# Patient Record
Sex: Male | Born: 1935 | Race: White | Hispanic: No | Smoking: Never smoker
Health system: Southern US, Community
[De-identification: ages and names within clinical notes are randomized; demographics above are authoritative.]

## PROBLEM LIST (undated history)

## (undated) DIAGNOSIS — F0390 Unspecified dementia without behavioral disturbance: Secondary | ICD-10-CM

## (undated) DIAGNOSIS — F419 Anxiety disorder, unspecified: Secondary | ICD-10-CM

## (undated) DIAGNOSIS — A159 Respiratory tuberculosis unspecified: Secondary | ICD-10-CM

## (undated) DIAGNOSIS — I10 Essential (primary) hypertension: Secondary | ICD-10-CM

## (undated) HISTORY — PX: APPENDECTOMY: SHX54

## (undated) HISTORY — PX: JOINT REPLACEMENT: SHX530

---

## 2016-11-12 ENCOUNTER — Ambulatory Visit: Payer: Medicare Other

## 2016-11-12 ENCOUNTER — Encounter: Payer: Self-pay | Admitting: Emergency Medicine

## 2016-11-12 ENCOUNTER — Ambulatory Visit
Admission: EM | Admit: 2016-11-12 | Discharge: 2016-11-12 | Disposition: A | Discharge status: Home / Self Care | Payer: Medicare Other | Attending: Family Medicine | Admitting: Family Medicine

## 2016-11-12 DIAGNOSIS — R531 Weakness: Secondary | ICD-10-CM | POA: Diagnosis not present

## 2016-11-12 DIAGNOSIS — Z79899 Other long term (current) drug therapy: Secondary | ICD-10-CM | POA: Insufficient documentation

## 2016-11-12 DIAGNOSIS — J209 Acute bronchitis, unspecified: Secondary | ICD-10-CM | POA: Insufficient documentation

## 2016-11-12 DIAGNOSIS — R509 Fever, unspecified: Secondary | ICD-10-CM

## 2016-11-12 HISTORY — DX: Unspecified dementia, unspecified severity, without behavioral disturbance, psychotic disturbance, mood disturbance, and anxiety: F03.90

## 2016-11-12 HISTORY — DX: Respiratory tuberculosis unspecified: A15.9

## 2016-11-12 HISTORY — DX: Essential (primary) hypertension: I10

## 2016-11-12 HISTORY — DX: Anxiety disorder, unspecified: F41.9

## 2016-11-12 LAB — BASIC METABOLIC PANEL
Anion gap: 8 (ref 5–15)
BUN: 25 mg/dL — AB (ref 6–20)
CHLORIDE: 100 mmol/L — AB (ref 101–111)
CO2: 25 mmol/L (ref 22–32)
Calcium: 8.8 mg/dL — ABNORMAL LOW (ref 8.9–10.3)
Creatinine, Ser: 1.26 mg/dL — ABNORMAL HIGH (ref 0.61–1.24)
GFR calc non Af Amer: 49 mL/min — ABNORMAL LOW (ref >60)
GFR, EST AFRICAN AMERICAN: 57 mL/min — AB (ref >60)
Glucose, Bld: 130 mg/dL — ABNORMAL HIGH (ref 65–99)
POTASSIUM: 4.2 mmol/L (ref 3.5–5.1)
SODIUM: 133 mmol/L — AB (ref 135–145)

## 2016-11-12 LAB — CBC WITH DIFFERENTIAL/PLATELET
BASOS ABS: 0.1 10*3/uL (ref 0–0.1)
Basophils Relative: 1 %
EOS PCT: 2 %
Eosinophils Absolute: 0.1 10*3/uL (ref 0–0.7)
HEMATOCRIT: 36.5 % — AB (ref 40.0–52.0)
HEMOGLOBIN: 12.6 g/dL — AB (ref 13.0–18.0)
LYMPHS PCT: 25 %
Lymphs Abs: 2 10*3/uL (ref 1.0–3.6)
MCH: 31.5 pg (ref 26.0–34.0)
MCHC: 34.5 g/dL (ref 32.0–36.0)
MCV: 91.3 fL (ref 80.0–100.0)
Monocytes Absolute: 0.7 10*3/uL (ref 0.2–1.0)
Monocytes Relative: 9 %
NEUTROS ABS: 5.3 10*3/uL (ref 1.4–6.5)
Neutrophils Relative %: 63 %
Platelets: 208 10*3/uL (ref 150–440)
RBC: 3.99 MIL/uL — AB (ref 4.40–5.90)
RDW: 13.5 % (ref 11.5–14.5)
WBC: 8.3 10*3/uL (ref 3.8–10.6)

## 2016-11-12 LAB — URINALYSIS, COMPLETE (UACMP) WITH MICROSCOPIC
BILIRUBIN URINE: NEGATIVE
Glucose, UA: NEGATIVE mg/dL
Ketones, ur: NEGATIVE mg/dL
Leukocytes, UA: NEGATIVE
NITRITE: NEGATIVE
PROTEIN: NEGATIVE mg/dL
Specific Gravity, Urine: 1.015 (ref 1.005–1.030)
pH: 6.5 (ref 5.0–8.0)

## 2016-11-12 MED ORDER — DOXYCYCLINE HYCLATE 100 MG PO TABS: 100.0000 mg | ORAL_TABLET | Freq: Two times a day (BID) | ORAL | 0 refills | Status: DC

## 2016-11-12 NOTE — ED Provider Notes (Signed)
[facility]    CSN: [id number] Arrival date & time: 11/12/16  1908     History   Chief Complaint Chief Complaint  Patient presents with  . Weakness  . Chills    HPI Joel Charles is a 83 y.o. male.   83 yo male presents with a c/o 1 week h/o generalized weakness and 12 hours of intermittent chills that started this morning. Denies any chest pain, shortness of breath, dysuria, vomiting, diarrhea, abdominal pain, difficulty urinating.    The history is provided by the patient.    Past Medical History:  Diagnosis Date  . Anxiety   . Dementia   . Hypertension   . Tuberculosis     There are no active problems to display for this patient.   Past Surgical History:  Procedure Laterality Date  . APPENDECTOMY    . JOINT REPLACEMENT     bilateral knee       Home Medications    Prior to Admission medications   Medication Sig Start Date End Date Taking? Authorizing Provider  ALPRAZolam (XANAX) 0.25 MG tablet Take 0.25 mg by mouth at bedtime. And 12.5 mg three times per day prn   Yes [provider]  amLODipine (NORVASC) 5 MG tablet Take 5 mg by mouth daily.   Yes [provider]  prednisoLONE acetate (PRED FORTE) 1 % ophthalmic suspension Place 1 drop into both eyes 3 (three) times daily.   Yes [provider]  doxycycline (VIBRA-TABS) 100 MG tablet Take 1 tablet (100 mg total) by mouth 2 (two) times daily. 11/12/16   Payton Mccallum, MD    Family History History reviewed. No pertinent family history.  Social History Social History  Substance Use Topics  . Smoking status: Never Smoker  . Smokeless tobacco: Never Used  . Alcohol use No     Allergies   Patient has no known allergies.   Review of Systems Review of Systems   Physical Exam Triage Vital Signs ED Triage Vitals  Enc Vitals Group     BP 11/12/16 1932 (!) 158/59     Pulse Rate 11/12/16 1932 67     Resp 11/12/16 1932 16     Temp 11/12/16 1932 (!)  100.4 F (38 C)     Temp Source 11/12/16 1932 Oral     SpO2 11/12/16 1932 98 %     Weight 11/12/16 1926 198 lb (89.8 kg)     Height 11/12/16 1926 6' (1.829 m)     Head Circumference --      Peak Flow --      Pain Score 11/12/16 1926 0     Pain Loc --      Pain Edu? --      Excl. in GC? --    No data found.   Updated Vital Signs BP (!) 158/59 (BP Location: Left Arm)   Pulse 67   Temp (!) 100.4 F (38 C) (Oral)   Resp 16   Ht 6' (1.829 m)   Wt 198 lb (89.8 kg)   SpO2 98%   BMI 26.85 kg/m   Visual Acuity Right Eye Distance:   Left Eye Distance:   Bilateral Distance:    Right Eye Near:   Left Eye Near:    Bilateral Near:     Physical Exam  Constitutional: He appears well-developed and well-nourished. No distress.  HENT:  Head: Normocephalic and atraumatic.  Right Ear: Tympanic membrane, external ear and ear canal normal.  Left Ear:  Tympanic membrane, external ear and ear canal normal.  Nose: Nose normal.  Mouth/Throat: Uvula is midline, oropharynx is clear and moist and mucous membranes are normal. No oropharyngeal exudate or tonsillar abscesses.  Eyes: Pupils are equal, round, and reactive to light. Conjunctivae and EOM are normal. Right eye exhibits no discharge. Left eye exhibits no discharge. No scleral icterus.  Neck: Normal range of motion. Neck supple. No tracheal deviation present. No thyromegaly present.  Cardiovascular: Normal rate, regular rhythm and normal heart sounds.   Pulmonary/Chest: Effort normal. No stridor. No respiratory distress. He has no wheezes. He has rales (bases bilaterally (left greater than right)). He exhibits no tenderness.  Lymphadenopathy:    He has no cervical adenopathy.  Neurological: He is alert.  Skin: Skin is warm and dry. No rash noted. He is not diaphoretic.  Nursing note and vitals reviewed.    UC Treatments / Results  Labs (all labs ordered are listed, but only abnormal results are displayed) Labs Reviewed  CBC WITH  DIFFERENTIAL/PLATELET - Abnormal; Notable for the following:       Result Value   RBC 3.99 (*)    Hemoglobin 12.6 (*)    HCT 36.5 (*)    All other components within normal limits  BASIC METABOLIC PANEL - Abnormal; Notable for the following:    Sodium 133 (*)    Chloride 100 (*)    Glucose, Bld 130 (*)    BUN 25 (*)    Creatinine, Ser 1.26 (*)    Calcium 8.8 (*)    GFR calc non Af Amer 49 (*)    GFR calc Af Amer 57 (*)    All other components within normal limits  URINALYSIS, COMPLETE (UACMP) WITH MICROSCOPIC - Abnormal; Notable for the following:    Hgb urine dipstick TRACE (*)    Squamous Epithelial / LPF 0-5 (*)    Bacteria, UA RARE (*)    All other components within normal limits  URINE CULTURE    EKG  EKG Interpretation None       Radiology Dg Chest 2 View  Result Date: 11/12/2016 CLINICAL DATA:  Fever and weakness today. EXAM: CHEST  2 VIEW COMPARISON:  None. FINDINGS: The cardiomediastinal silhouette is unremarkable. Right upper lung scarring identified. There is no evidence of focal airspace disease, pulmonary edema, suspicious pulmonary nodule/mass, pleural effusion, or pneumothorax. No acute bony abnormalities are identified. Remote left rib fractures are present. IMPRESSION: No evidence of acute cardiopulmonary disease. Electronically Signed   By: Harmon PierJeffrey  Hu M.D.   On: 11/12/2016 20:33    Procedures Procedures (including critical care time)  Medications Ordered in UC Medications - No data to display   Initial Impression / Assessment and Plan / UC Course  I have reviewed the triage vital signs and the nursing notes.  Pertinent labs & imaging results that were available during my care of the patient were reviewed by me and considered in my medical decision making (see chart for details).       Final Clinical Impressions(s) / UC Diagnoses   Final diagnoses:  Fever, unspecified  Weakness  Acute bronchitis, unspecified organism    New  Prescriptions Discharge Medication List as of 11/12/2016  8:51 PM    START taking these medications   Details  doxycycline (VIBRA-TABS) 100 MG tablet Take 1 tablet (100 mg total) by mouth 2 (two) times daily., Starting Wed 11/12/2016, Print       1. Labs/x-ray results and diagnosis reviewed with patient 2. rx  as per orders above; reviewed possible side effects, interactions, risks and benefits  3. Recommend supportive treatment with rest, fluids, tylenol prn  4. Follow-up prn if symptoms worsen or don't improve Controlled Substance Prescriptions [place] Controlled Substance Registry consulted? Not Applicable   Payton Mccallum, MD 11/12/16 2100

## 2016-11-12 NOTE — ED Triage Notes (Signed)
Patient in today with his daughter. [facility] ago and having some difficulty adjusting. Patient's daughter does state that patient has dementia. Patient c/o weakness and chills x this morning.

## 2016-11-12 NOTE — Discharge Instructions (Signed)
Increase water intake Tylenol as needed Follow up with primary care

## 2016-11-14 LAB — URINE CULTURE
CULTURE: NO GROWTH
SPECIAL REQUESTS: NORMAL

## 2017-09-05 ENCOUNTER — Emergency Department
Admission: EM | Admit: 2017-09-05 | Discharge: 2017-09-05 | Disposition: A | Discharge status: Home / Self Care | Payer: Medicare Other | Attending: Emergency Medicine | Admitting: Emergency Medicine

## 2017-09-05 ENCOUNTER — Other Ambulatory Visit: Payer: Self-pay

## 2017-09-05 ENCOUNTER — Emergency Department: Payer: Medicare Other

## 2017-09-05 DIAGNOSIS — R05 Cough: Secondary | ICD-10-CM | POA: Diagnosis not present

## 2017-09-05 DIAGNOSIS — I10 Essential (primary) hypertension: Secondary | ICD-10-CM | POA: Diagnosis not present

## 2017-09-05 DIAGNOSIS — Z96653 Presence of artificial knee joint, bilateral: Secondary | ICD-10-CM | POA: Insufficient documentation

## 2017-09-05 DIAGNOSIS — R059 Cough, unspecified: Secondary | ICD-10-CM

## 2017-09-05 LAB — BASIC METABOLIC PANEL
Anion gap: 10 (ref 5–15)
BUN: 27 mg/dL — AB (ref 8–23)
CHLORIDE: 95 mmol/L — AB (ref 98–111)
CO2: 26 mmol/L (ref 22–32)
CREATININE: 1.14 mg/dL (ref 0.61–1.24)
Calcium: 9.5 mg/dL (ref 8.9–10.3)
GFR calc non Af Amer: 55 mL/min — ABNORMAL LOW (ref >60)
Glucose, Bld: 126 mg/dL — ABNORMAL HIGH (ref 70–99)
POTASSIUM: 3.9 mmol/L (ref 3.5–5.1)
SODIUM: 131 mmol/L — AB (ref 135–145)

## 2017-09-05 LAB — CBC WITH DIFFERENTIAL/PLATELET
BASOS PCT: 1 %
Basophils Absolute: 0.1 10*3/uL (ref 0–0.1)
EOS ABS: 0.1 10*3/uL (ref 0–0.7)
EOS PCT: 1 %
HCT: 34.5 % — ABNORMAL LOW (ref 40.0–52.0)
HEMOGLOBIN: 12.3 g/dL — AB (ref 13.0–18.0)
LYMPHS ABS: 1.9 10*3/uL (ref 1.0–3.6)
Lymphocytes Relative: 21 %
MCH: 32.4 pg (ref 26.0–34.0)
MCHC: 35.6 g/dL (ref 32.0–36.0)
MCV: 90.9 fL (ref 80.0–100.0)
Monocytes Absolute: 0.9 10*3/uL (ref 0.2–1.0)
Monocytes Relative: 10 %
NEUTROS PCT: 67 %
Neutro Abs: 6 10*3/uL (ref 1.4–6.5)
PLATELETS: 203 10*3/uL (ref 150–440)
RBC: 3.8 MIL/uL — AB (ref 4.40–5.90)
RDW: 13.4 % (ref 11.5–14.5)
WBC: 8.8 10*3/uL (ref 3.8–10.6)

## 2017-09-05 LAB — URINALYSIS, COMPLETE (UACMP) WITH MICROSCOPIC
BACTERIA UA: NONE SEEN
Bilirubin Urine: NEGATIVE
Glucose, UA: NEGATIVE mg/dL
Hgb urine dipstick: NEGATIVE
Ketones, ur: NEGATIVE mg/dL
Leukocytes, UA: NEGATIVE
NITRITE: NEGATIVE
PH: 7 (ref 5.0–8.0)
Protein, ur: NEGATIVE mg/dL
Specific Gravity, Urine: 1.013 (ref 1.005–1.030)

## 2017-09-05 LAB — BRAIN NATRIURETIC PEPTIDE: B NATRIURETIC PEPTIDE 5: 337 pg/mL — AB (ref 0.0–100.0)

## 2017-09-05 LAB — TROPONIN I

## 2017-09-05 MED ORDER — AZITHROMYCIN 250 MG PO TABS: ORAL_TABLET | ORAL | 0 refills | Status: DC

## 2017-09-05 NOTE — ED Notes (Signed)
Pt to radiology.

## 2017-09-05 NOTE — ED Provider Notes (Signed)
[facility] Emergency Department Provider Note       Time seen: ----------------------------------------- 11:53 AM on 09/05/2017 -----------------------------------------   I have reviewed the triage vital signs and the nursing notes.  HISTORY   Chief Complaint No chief complaint on file.    HPI Joel Charles is a 84 y.o. male with a history of anxiety, dementia, hypertension and TB who presents to the ED for cough and congestion.  Reportedly he has had increased cough and congestion over the past several days.  He is currently a resident of [facility].  He has not had a fever or chills, but does feel weaker than normal.  He denies any chest pain at this time.  He was noted to be wheezing in route by EMS.  Past Medical History:  Diagnosis Date  . Anxiety   . Dementia   . Hypertension   . Tuberculosis     There are no active problems to display for this patient.   Past Surgical History:  Procedure Laterality Date  . APPENDECTOMY    . JOINT REPLACEMENT     bilateral knee    Allergies Patient has no known allergies.  Social History Social History   Tobacco Use  . Smoking status: Never Smoker  . Smokeless tobacco: Never Used  Substance Use Topics  . Alcohol use: No  . Drug use: No   Review of Systems Constitutional: Negative for fever. Cardiovascular: Negative for chest pain. Respiratory: Positive for cough Gastrointestinal: Negative for abdominal pain, vomiting and diarrhea. Musculoskeletal: Negative for back pain. Skin: Negative for rash. Neurological: Negative for headaches, positive for generalized weakness  All systems negative/normal/unremarkable except as stated in the HPI  ____________________________________________   PHYSICAL EXAM:  VITAL SIGNS: ED Triage Vitals  Enc Vitals Group     BP      Pulse      Resp      Temp      Temp src      SpO2      Weight      Height      Head Circumference       Peak Flow      Pain Score      Pain Loc      Pain Edu?      Excl. in GC?    Constitutional:  Well appearing and in no distress. Eyes: Conjunctivae are normal. Normal extraocular movements. ENT   Head: Normocephalic and atraumatic.   Nose: No congestion/rhinnorhea.   Mouth/Throat: Mucous membranes are moist.   Neck: No stridor. Cardiovascular: Normal rate, regular rhythm. No murmurs, rubs, or gallops. Respiratory: Normal respiratory effort without tachypnea nor retractions.  Minimal wheezing is noted Gastrointestinal: Soft and nontender. Normal bowel sounds Musculoskeletal: Nontender with normal range of motion in extremities. No lower extremity tenderness nor edema. Neurologic:  Normal speech and language. No gross focal neurologic deficits are appreciated.  Skin:  Skin is warm, dry and intact. No rash noted. Psychiatric: Mood and affect are normal. Speech and behavior are normal.  ___________________________________________  ED COURSE:  As part of my medical decision making, I reviewed the following data within the electronic MEDICAL RECORD NUMBER History obtained from family if available, nursing notes, old chart and ekg, as well as notes from prior ED visits. Patient presented for shortness of breath and cough, we will assess with labs and imaging as indicated at this time.   Procedures ____________________________________________   LABS (pertinent positives/negatives)  Labs Reviewed  CBC WITH DIFFERENTIAL/PLATELET - Abnormal; Notable for the following components:      Result Value   RBC 3.80 (*)    Hemoglobin 12.3 (*)    HCT 34.5 (*)    All other components within normal limits  BASIC METABOLIC PANEL - Abnormal; Notable for the following components:   Sodium 131 (*)    Chloride 95 (*)    Glucose, Bld 126 (*)    BUN 27 (*)    GFR calc non Af Amer 55 (*)    All other components within normal limits  BRAIN NATRIURETIC PEPTIDE - Abnormal; Notable for the  following components:   B Natriuretic Peptide 337.0 (*)    All other components within normal limits  URINALYSIS, COMPLETE (UACMP) WITH MICROSCOPIC - Abnormal; Notable for the following components:   Color, Urine YELLOW (*)    APPearance CLEAR (*)    All other components within normal limits  TROPONIN I    RADIOLOGY Images were viewed by me  Chest x-ray IMPRESSION: No evidence of acute cardiopulmonary disease.  Chronic pulmonary changes and peribronchial thickening/interstitial prominence. ____________________________________________  DIFFERENTIAL DIAGNOSIS   URI, pneumonia, CHF, unstable angina  FINAL ASSESSMENT AND PLAN  Cough  Plan: The patient had presented for cough and weakness. Patient's labs did not reveal any acute process. Patient's imaging was negative for pneumonia.  Due to his persistent cough I will prescribe a Z-Pak.  He has had cough for about a week at this point so I think this is reasonable.   Ulice DashJohnathan E Williams, MD   Note: This note was generated in part or whole with voice recognition software. Voice recognition is usually quite accurate but there are transcription errors that can and very often do occur. I apologize for any typographical errors that were not detected and corrected.     Emily FilbertWilliams, Jonathan E, MD 09/05/17 1318

## 2017-09-05 NOTE — ED Triage Notes (Signed)
Pt arrives from [facility] c/o productive cough x 1 week worsening today.

## 2017-09-05 NOTE — ED Notes (Signed)
Random urine collected and sent to lab to be held pending orders

## 2017-09-10 ENCOUNTER — Other Ambulatory Visit: Payer: Self-pay | Admitting: Emergency Medicine

## 2017-09-10 ENCOUNTER — Ambulatory Visit
Admission: RE | Admit: 2017-09-10 | Discharge: 2017-09-10 | Disposition: A | Discharge status: Home / Self Care | Payer: Medicare Other | Source: Ambulatory Visit | Attending: Emergency Medicine | Admitting: Emergency Medicine

## 2017-09-10 DIAGNOSIS — R059 Cough, unspecified: Secondary | ICD-10-CM

## 2017-09-10 DIAGNOSIS — R05 Cough: Secondary | ICD-10-CM

## 2017-11-04 ENCOUNTER — Encounter: Payer: Self-pay | Admitting: Emergency Medicine

## 2017-11-04 ENCOUNTER — Emergency Department
Admission: EM | Admit: 2017-11-04 | Discharge: 2017-11-04 | Disposition: A | Discharge status: Home / Self Care | Payer: Medicare Other | Attending: Emergency Medicine | Admitting: Emergency Medicine

## 2017-11-04 ENCOUNTER — Emergency Department: Payer: Medicare Other

## 2017-11-04 DIAGNOSIS — Y9389 Activity, other specified: Secondary | ICD-10-CM | POA: Insufficient documentation

## 2017-11-04 DIAGNOSIS — I10 Essential (primary) hypertension: Secondary | ICD-10-CM | POA: Insufficient documentation

## 2017-11-04 DIAGNOSIS — S0990XA Unspecified injury of head, initial encounter: Secondary | ICD-10-CM | POA: Diagnosis present

## 2017-11-04 DIAGNOSIS — F419 Anxiety disorder, unspecified: Secondary | ICD-10-CM | POA: Insufficient documentation

## 2017-11-04 DIAGNOSIS — Z7982 Long term (current) use of aspirin: Secondary | ICD-10-CM | POA: Diagnosis not present

## 2017-11-04 DIAGNOSIS — F0390 Unspecified dementia without behavioral disturbance: Secondary | ICD-10-CM | POA: Diagnosis not present

## 2017-11-04 DIAGNOSIS — Y998 Other external cause status: Secondary | ICD-10-CM | POA: Insufficient documentation

## 2017-11-04 DIAGNOSIS — Z96653 Presence of artificial knee joint, bilateral: Secondary | ICD-10-CM | POA: Insufficient documentation

## 2017-11-04 DIAGNOSIS — W19XXXA Unspecified fall, initial encounter: Secondary | ICD-10-CM

## 2017-11-04 DIAGNOSIS — Y92129 Unspecified place in nursing home as the place of occurrence of the external cause: Secondary | ICD-10-CM | POA: Insufficient documentation

## 2017-11-04 DIAGNOSIS — Z79899 Other long term (current) drug therapy: Secondary | ICD-10-CM | POA: Diagnosis not present

## 2017-11-04 DIAGNOSIS — W0110XA Fall on same level from slipping, tripping and stumbling with subsequent striking against unspecified object, initial encounter: Secondary | ICD-10-CM | POA: Insufficient documentation

## 2017-11-04 DIAGNOSIS — S0101XA Laceration without foreign body of scalp, initial encounter: Secondary | ICD-10-CM | POA: Insufficient documentation

## 2017-11-04 DIAGNOSIS — E860 Dehydration: Secondary | ICD-10-CM | POA: Diagnosis not present

## 2017-11-04 LAB — URINALYSIS, COMPLETE (UACMP) WITH MICROSCOPIC
Bacteria, UA: NONE SEEN
Bilirubin Urine: NEGATIVE
GLUCOSE, UA: NEGATIVE mg/dL
Hgb urine dipstick: NEGATIVE
Ketones, ur: NEGATIVE mg/dL
Leukocytes, UA: NEGATIVE
Nitrite: NEGATIVE
PROTEIN: NEGATIVE mg/dL
Specific Gravity, Urine: 1.015 (ref 1.005–1.030)
Squamous Epithelial / LPF: NONE SEEN (ref 0–5)
WBC UA: NONE SEEN WBC/hpf (ref 0–5)
pH: 5 (ref 5.0–8.0)

## 2017-11-04 LAB — CBC WITH DIFFERENTIAL/PLATELET
Abs Immature Granulocytes: 0.06 10*3/uL (ref 0.00–0.07)
BASOS PCT: 1 %
Basophils Absolute: 0.1 10*3/uL (ref 0.0–0.1)
EOS ABS: 0.3 10*3/uL (ref 0.0–0.5)
Eosinophils Relative: 4 %
HCT: 32.4 % — ABNORMAL LOW (ref 39.0–52.0)
HEMOGLOBIN: 11 g/dL — AB (ref 13.0–17.0)
Immature Granulocytes: 1 %
Lymphocytes Relative: 18 %
Lymphs Abs: 1.3 10*3/uL (ref 0.7–4.0)
MCH: 31.4 pg (ref 26.0–34.0)
MCHC: 34 g/dL (ref 30.0–36.0)
MCV: 92.6 fL (ref 80.0–100.0)
MONO ABS: 0.8 10*3/uL (ref 0.1–1.0)
MONOS PCT: 10 %
NEUTROS ABS: 5 10*3/uL (ref 1.7–7.7)
Neutrophils Relative %: 66 %
Platelets: 184 10*3/uL (ref 150–400)
RBC: 3.5 MIL/uL — AB (ref 4.22–5.81)
RDW: 12.8 % (ref 11.5–15.5)
WBC: 7.5 10*3/uL (ref 4.0–10.5)
nRBC: 0 % (ref 0.0–0.2)

## 2017-11-04 LAB — COMPREHENSIVE METABOLIC PANEL
ALK PHOS: 57 U/L (ref 38–126)
ALT: 14 U/L (ref 0–44)
AST: 19 U/L (ref 15–41)
Albumin: 4 g/dL (ref 3.5–5.0)
Anion gap: 10 (ref 5–15)
BUN: 32 mg/dL — ABNORMAL HIGH (ref 8–23)
CO2: 26 mmol/L (ref 22–32)
CREATININE: 1.41 mg/dL — AB (ref 0.61–1.24)
Calcium: 8.8 mg/dL — ABNORMAL LOW (ref 8.9–10.3)
Chloride: 99 mmol/L (ref 98–111)
GFR, EST AFRICAN AMERICAN: 49 mL/min — AB (ref >60)
GFR, EST NON AFRICAN AMERICAN: 43 mL/min — AB (ref >60)
Glucose, Bld: 105 mg/dL — ABNORMAL HIGH (ref 70–99)
Potassium: 3.5 mmol/L (ref 3.5–5.1)
Sodium: 135 mmol/L (ref 135–145)
Total Bilirubin: 0.8 mg/dL (ref 0.3–1.2)
Total Protein: 7 g/dL (ref 6.5–8.1)

## 2017-11-04 LAB — TROPONIN I

## 2017-11-04 MED ORDER — SODIUM CHLORIDE 0.9 % IV BOLUS
1000.0000 mL | Freq: Once | INTRAVENOUS | Status: AC
  Administered 2017-11-04: 1000 mL via INTRAVENOUS

## 2017-11-04 NOTE — ED Notes (Signed)
PT in NAD at time of deaprture, VSS, pt back to [facility] via aems. Grandson signed esigature due to pt preference

## 2017-11-04 NOTE — Discharge Instructions (Signed)
1.  Staples removal in 7 to 10 days. 2.  Drink plenty of fluids daily. 3.  Return to the ER for worsening symptoms, persistent vomiting, difficulty breathing or other concerns.

## 2017-11-04 NOTE — ED Notes (Signed)
Daughter states they have been waiting too long for EMS and will take pt back to facility at this time

## 2017-11-04 NOTE — ED Notes (Signed)
Pt up to use bedside urinal, pt steady on feet with this RN

## 2017-11-04 NOTE — ED Triage Notes (Signed)
Pt arrived from [facility] facility post fall, by EMS. Pt was in route to bathroom when he lost balance and fell to the floor, hitting posterior head. Pt has approximate 1 inch laceration to the posterior head. Bandage applied, bleeding controled.

## 2017-11-04 NOTE — ED Provider Notes (Signed)
[facility] Emergency Department Provider Note   ____________________________________________   First MD Initiated Contact with Patient 11/04/17 [phone]     (approximate)  I have reviewed the triage vital signs and the nursing notes.   HISTORY  Chief Complaint Fall  Level V caveat: Limited by dementia; majority of history provided by patient's daughter  HPI Joel Charles is a 84 y.o. male brought to the ED via EMS from [facility] ridge status post fall.  Reportedly patient was in route to the restroom when he lost his balance and fell to the floor, striking his head.  Patient denies LOC.  Denies anticoagulant use.  Daughter tells me patient has seemed more confused this week.  Urinalysis was performed at the facility but they have not been told results yet.  Patient denies fever, chills, chest pain, shortness of breath, abdominal pain, nausea or vomiting.   Past Medical History:  Diagnosis Date  . Anxiety   . Dementia (HCC)   . Hypertension   . Tuberculosis     There are no active problems to display for this patient.   Past Surgical History:  Procedure Laterality Date  . APPENDECTOMY    . JOINT REPLACEMENT     bilateral knee    Prior to Admission medications   Medication Sig Start Date End Date Taking? Authorizing Provider  ALPRAZolam (XANAX) 0.25 MG tablet Take 0.25 mg by mouth at bedtime.     [provider]  ALPRAZolam Prudy Feeler) 0.25 MG tablet Take 0.25 mg by mouth 3 (three) times daily as needed for anxiety.    [provider]  amLODipine (NORVASC) 5 MG tablet Take 5 mg by mouth daily.    [provider]  aspirin EC 81 MG tablet Take 81 mg by mouth every other day.    [provider]  azithromycin (ZITHROMAX Z-PAK) 250 MG tablet Take 2 tablets (500 mg) on  Day 1,  followed by 1 tablet (250 mg) once daily on Days 2 through 5. 09/05/17   Emily Filbert, MD  busPIRone (BUSPAR) 5 MG tablet Take 5 mg by mouth 2  (two) times daily.    [provider]  cholecalciferol (VITAMIN D) 1000 units tablet Take 2,000 Units by mouth daily.    [provider]  guaifenesin (ROBITUSSIN) 100 MG/5ML syrup Take 300 mg by mouth every 6 (six) hours as needed for cough or congestion.    [provider]  hydrochlorothiazide (HYDRODIURIL) 25 MG tablet Take 25 mg by mouth daily.    [provider]  memantine (NAMENDA) 10 MG tablet Take 10 mg by mouth 2 (two) times daily.    [provider]  mineral oil liquid Instill 2 drops in both ears every Monday morning    [provider]  polyethylene glycol (MIRALAX / GLYCOLAX) packet Take 17 g by mouth daily as needed for mild constipation or moderate constipation.    [provider]  prednisoLONE acetate (PRED FORTE) 1 % ophthalmic suspension Place 1 drop into both eyes 3 (three) times daily.    [provider]  sodium chloride (MURO 128) 5 % ophthalmic solution Place 1 drop into both eyes 3 (three) times daily.    [provider]  triamcinolone cream (KENALOG) 0.1 % Apply 1 application topically 2 (two) times daily as needed.    [provider]    Allergies Patient has no known allergies.  History reviewed. No pertinent family history.  Social History Social History  Tobacco Use  . Smoking status: Never Smoker  . Smokeless tobacco: Never Used  Substance Use Topics  . Alcohol use: No  . Drug use: No    Review of Systems  Constitutional: Positive for fall.  No fever/chills Eyes: No visual changes. ENT: No sore throat. Cardiovascular: Denies chest pain. Respiratory: Denies shortness of breath. Gastrointestinal: No abdominal pain.  No nausea, no vomiting.  No diarrhea.  No constipation. Genitourinary: Negative for dysuria. Musculoskeletal: Negative for back pain. Skin: Negative for rash. Neurological: Positive for confusion prior to fall.  Negative for headaches, focal weakness or  numbness.   ____________________________________________   PHYSICAL EXAM:  VITAL SIGNS: ED Triage Vitals  Enc Vitals Group     BP      Pulse      Resp      Temp      Temp src      SpO2      Weight      Height      Head Circumference      Peak Flow      Pain Score      Pain Loc      Pain Edu?      Excl. in GC?     Constitutional: Alert and oriented.  Elderly appearing and in no acute distress. Eyes: Conjunctivae are normal. PERRL. EOMI. Head: Approximately 1.5 cm horizontally linear scalp laceration to posterior scalp without active bleeding. Nose: Atraumatic. Mouth/Throat: Mucous membranes are moist.  No dental malocclusion. Neck: No stridor.  No cervical spine tenderness to palpation. Cardiovascular: Normal rate, regular rhythm. Grossly normal heart sounds.  Good peripheral circulation. Respiratory: Normal respiratory effort.  No retractions. Lungs CTAB. Gastrointestinal: Soft and nontender. No distention. No abdominal bruits. No CVA tenderness. Musculoskeletal: No spinal tenderness to palpation.  Pelvis stable.  No lower extremity tenderness nor edema.  No joint effusions. Neurologic: Alert and oriented to person and place.  Normal speech and language. No gross focal neurologic deficits are appreciated.  Skin:  Skin is warm, dry and intact. No rash noted. Psychiatric: Mood and affect are normal. Speech and behavior are normal.  ____________________________________________   LABS (all labs ordered are listed, but only abnormal results are displayed)  Labs Reviewed  CBC WITH DIFFERENTIAL/PLATELET - Abnormal; Notable for the following components:      Result Value   RBC 3.50 (*)    Hemoglobin 11.0 (*)    HCT 32.4 (*)    All other components within normal limits  COMPREHENSIVE METABOLIC PANEL - Abnormal; Notable for the following components:   Glucose, Bld 105 (*)    BUN 32 (*)    Creatinine, Ser 1.41 (*)    Calcium 8.8 (*)    GFR calc non Af Amer 43 (*)     GFR calc Af Amer 49 (*)    All other components within normal limits  URINALYSIS, COMPLETE (UACMP) WITH MICROSCOPIC - Abnormal; Notable for the following components:   Color, Urine YELLOW (*)    APPearance CLEAR (*)    All other components within normal limits  URINE CULTURE  TROPONIN I   ____________________________________________  EKG  ED ECG REPORT I, SUNG,JADE J, the attending physician, personally viewed and interpreted this ECG.   Date: 11/04/2017  EKG Time: 0408  Rate: 62  Rhythm: normal EKG, normal sinus rhythm  Axis: Normal  Intervals:none, PVC  ST&T Change: Nonspecific  ____________________________________________  RADIOLOGY  ED MD interpretation: No acute ICH or osseous injury  Official radiology report(s):  Ct Head Wo Contrast  Result Date: 11/04/2017 CLINICAL DATA:  84 y/o  M; head trauma. EXAM: CT HEAD WITHOUT CONTRAST CT CERVICAL SPINE WITHOUT CONTRAST TECHNIQUE: Multidetector CT imaging of the head and cervical spine was performed following the standard protocol without intravenous contrast. Multiplanar CT image reconstructions of the cervical spine were also generated. COMPARISON:  None. FINDINGS: CT HEAD FINDINGS Brain: No evidence of acute infarction, hemorrhage, hydrocephalus, extra-axial collection or mass lesion/mass effect. Nonspecific cortical calcifications in the bilateral frontal lobes, left occipital lobe, and the right cerebellar hemisphere likely representing sequelae of prior infectious or inflammatory process. Mild chronic microvascular ischemic changes and volume loss of the brain for age. Vascular: Calcific atherosclerosis of the carotid siphons. No hyperdense vessel identified. Skull: Normal. Negative for fracture or focal lesion. Sinuses/Orbits: Normal aeration of visible paranasal sinuses and right mastoid air cells. Left mastoid opacification. Bilateral intra-ocular lens replacement. Other: None. CT CERVICAL SPINE FINDINGS Alignment: C6-7 and  C7-T1 grade 1 anterolisthesis. Straightening of cervical lordosis. Skull base and vertebrae: No acute fracture. No primary bone lesion or focal pathologic process. Small erosive changes of the odontoid process and anterior arch of C1 with a small calcified pannus which may represent degenerative arthritis or crystal deposition disease. C4-5 left-sided vertebral body and facet fusion. Soft tissues and spinal canal: No prevertebral fluid or swelling. No visible canal hematoma. Disc levels: Cervical spondylosis with moderate discogenic degenerative changes from C3 through C6 and right greater than left facet hypertrophy. Uncovertebral and facet hypertrophy results in bony neural foraminal stenosis at the right C3-4, left C4-5, bilateral C5-6 levels. No high-grade bony canal stenosis. Upper chest: Calcified scarring in the lung apices probably representing sequelae of prior granulomatous disease. Other: 9 mm nodule within the right lobe of the thyroid. IMPRESSION: 1. No acute intracranial abnormality. 2. No acute fracture or dislocation of the cervical spine. 3. Mild chronic microvascular ischemic changes and volume loss of the brain for age. 4. Scattered calcifications in the brain compatible with prior infectious or inflammatory process. 5. Biapical scarring of the lungs with calcifications, probably sequelae of prior granulomatous disease. Moderate cervical spondylosis with grade 1 anterolisthesis at C6-7 and C7-T1. 6. Small erosive changes at the odontoid process and anterior arch of C1 and small calcified pannus may reflect crystal deposition disease or erosive arthritis. Electronically Signed   By: Mitzi Hansen M.D.   On: 11/04/2017 05:15   Ct Cervical Spine Wo Contrast  Result Date: 11/04/2017 CLINICAL DATA:  84 y/o  M; head trauma. EXAM: CT HEAD WITHOUT CONTRAST CT CERVICAL SPINE WITHOUT CONTRAST TECHNIQUE: Multidetector CT imaging of the head and cervical spine was performed following the  standard protocol without intravenous contrast. Multiplanar CT image reconstructions of the cervical spine were also generated. COMPARISON:  None. FINDINGS: CT HEAD FINDINGS Brain: No evidence of acute infarction, hemorrhage, hydrocephalus, extra-axial collection or mass lesion/mass effect. Nonspecific cortical calcifications in the bilateral frontal lobes, left occipital lobe, and the right cerebellar hemisphere likely representing sequelae of prior infectious or inflammatory process. Mild chronic microvascular ischemic changes and volume loss of the brain for age. Vascular: Calcific atherosclerosis of the carotid siphons. No hyperdense vessel identified. Skull: Normal. Negative for fracture or focal lesion. Sinuses/Orbits: Normal aeration of visible paranasal sinuses and right mastoid air cells. Left mastoid opacification. Bilateral intra-ocular lens replacement. Other: None. CT CERVICAL SPINE FINDINGS Alignment: C6-7 and C7-T1 grade 1 anterolisthesis. Straightening of cervical lordosis. Skull base and vertebrae: No acute fracture. No primary bone lesion or focal pathologic  process. Small erosive changes of the odontoid process and anterior arch of C1 with a small calcified pannus which may represent degenerative arthritis or crystal deposition disease. C4-5 left-sided vertebral body and facet fusion. Soft tissues and spinal canal: No prevertebral fluid or swelling. No visible canal hematoma. Disc levels: Cervical spondylosis with moderate discogenic degenerative changes from C3 through C6 and right greater than left facet hypertrophy. Uncovertebral and facet hypertrophy results in bony neural foraminal stenosis at the right C3-4, left C4-5, bilateral C5-6 levels. No high-grade bony canal stenosis. Upper chest: Calcified scarring in the lung apices probably representing sequelae of prior granulomatous disease. Other: 9 mm nodule within the right lobe of the thyroid. IMPRESSION: 1. No acute intracranial  abnormality. 2. No acute fracture or dislocation of the cervical spine. 3. Mild chronic microvascular ischemic changes and volume loss of the brain for age. 4. Scattered calcifications in the brain compatible with prior infectious or inflammatory process. 5. Biapical scarring of the lungs with calcifications, probably sequelae of prior granulomatous disease. Moderate cervical spondylosis with grade 1 anterolisthesis at C6-7 and C7-T1. 6. Small erosive changes at the odontoid process and anterior arch of C1 and small calcified pannus may reflect crystal deposition disease or erosive arthritis. Electronically Signed   By: Mitzi Hansen M.D.   On: 11/04/2017 05:15    ____________________________________________   PROCEDURES  Procedure(s) performed:     Marland KitchenMarland KitchenLaceration Repair Date/Time: 11/04/2017 5:34 AM Performed by: Irean Hong, MD Authorized by: Irean Hong, MD   Consent:    Consent obtained:  Verbal   Consent given by:  Patient and guardian   Risks discussed:  Infection, pain, poor cosmetic result and poor wound healing Anesthesia (see MAR for exact dosages):    Anesthesia method: PainEase. Laceration details:    Location:  Scalp   Length (cm):  1.5   Depth (mm):  1 Repair type:    Repair type:  Simple Exploration:    Hemostasis achieved with:  Direct pressure   Wound exploration: entire depth of wound probed and visualized     Contaminated: no   Treatment:    Area cleansed with:  Saline   Amount of cleaning:  Standard   Irrigation solution:  Sterile saline   Visualized foreign bodies/material removed: no   Skin repair:    Repair method:  Staples   Number of staples:  4 Approximation:    Approximation:  Loose Post-procedure details:    Patient tolerance of procedure:  Tolerated well, no immediate complications    Critical Care performed: No  ____________________________________________   INITIAL IMPRESSION / ASSESSMENT AND PLAN / ED COURSE  As part of  my medical decision making, I reviewed the following data within the electronic MEDICAL RECORD NUMBER History obtained from family, Nursing notes reviewed and incorporated, Labs reviewed, EKG interpreted, Radiograph reviewed, Discussed with admitting physician and Notes from prior ED visits   84 year old male who presents with mechanical fall.  Daughter states he has seemed more confused this week.  Will check lab work, urinalysis, CT head and cervical spine.  Will reassess.  Clinical Course as of Nov 04 652  Wed Nov 04, 2017  0532 Updated patient and daughter of all test results.  Patient tolerated staples well.  Will discharge home after completion of IV fluids.   [JS]    Clinical Course User Index [JS] Irean Hong, MD     ____________________________________________   FINAL CLINICAL IMPRESSION(S) / ED DIAGNOSES  Final diagnoses:  Fall, initial  encounter  Laceration of scalp, initial encounter  Dehydration     ED Discharge Orders    None       Note:  This document was prepared using Dragon voice recognition software and may include unintentional dictation errors.    Irean Hong, MD 11/04/17 [phone]

## 2017-11-05 LAB — URINE CULTURE: Culture: NO GROWTH

## 2018-01-08 ENCOUNTER — Inpatient Hospital Stay
Admission: EM | Admit: 2018-01-08 | Discharge: 2018-01-11 | DRG: 872 | Disposition: A | Discharge status: Hospice | Payer: Medicare Other | Attending: Internal Medicine | Admitting: Internal Medicine

## 2018-01-08 ENCOUNTER — Emergency Department: Payer: Medicare Other

## 2018-01-08 ENCOUNTER — Other Ambulatory Visit: Payer: Self-pay

## 2018-01-08 ENCOUNTER — Inpatient Hospital Stay: Payer: Medicare Other

## 2018-01-08 DIAGNOSIS — N400 Enlarged prostate without lower urinary tract symptoms: Secondary | ICD-10-CM | POA: Diagnosis present

## 2018-01-08 DIAGNOSIS — Z96653 Presence of artificial knee joint, bilateral: Secondary | ICD-10-CM | POA: Diagnosis present

## 2018-01-08 DIAGNOSIS — I10 Essential (primary) hypertension: Secondary | ICD-10-CM | POA: Diagnosis present

## 2018-01-08 DIAGNOSIS — Z515 Encounter for palliative care: Secondary | ICD-10-CM | POA: Diagnosis not present

## 2018-01-08 DIAGNOSIS — F419 Anxiety disorder, unspecified: Secondary | ICD-10-CM | POA: Diagnosis present

## 2018-01-08 DIAGNOSIS — Z978 Presence of other specified devices: Secondary | ICD-10-CM

## 2018-01-08 DIAGNOSIS — E872 Acidosis: Secondary | ICD-10-CM | POA: Diagnosis present

## 2018-01-08 DIAGNOSIS — N179 Acute kidney failure, unspecified: Secondary | ICD-10-CM | POA: Diagnosis present

## 2018-01-08 DIAGNOSIS — Z7982 Long term (current) use of aspirin: Secondary | ICD-10-CM

## 2018-01-08 DIAGNOSIS — A419 Sepsis, unspecified organism: Secondary | ICD-10-CM | POA: Diagnosis not present

## 2018-01-08 DIAGNOSIS — K559 Vascular disorder of intestine, unspecified: Secondary | ICD-10-CM | POA: Diagnosis present

## 2018-01-08 DIAGNOSIS — F0390 Unspecified dementia without behavioral disturbance: Secondary | ICD-10-CM | POA: Diagnosis present

## 2018-01-08 DIAGNOSIS — R6520 Severe sepsis without septic shock: Secondary | ICD-10-CM | POA: Diagnosis present

## 2018-01-08 DIAGNOSIS — Z79899 Other long term (current) drug therapy: Secondary | ICD-10-CM | POA: Diagnosis not present

## 2018-01-08 DIAGNOSIS — Z66 Do not resuscitate: Secondary | ICD-10-CM | POA: Diagnosis not present

## 2018-01-08 LAB — COMPREHENSIVE METABOLIC PANEL
ALBUMIN: 3.6 g/dL (ref 3.5–5.0)
ALT: 57 U/L — AB (ref 0–44)
AST: 116 U/L — AB (ref 15–41)
Alkaline Phosphatase: 63 U/L (ref 38–126)
Anion gap: 20 — ABNORMAL HIGH (ref 5–15)
BUN: 74 mg/dL — AB (ref 8–23)
CO2: 12 mmol/L — ABNORMAL LOW (ref 22–32)
CREATININE: 4.03 mg/dL — AB (ref 0.61–1.24)
Calcium: 7.9 mg/dL — ABNORMAL LOW (ref 8.9–10.3)
Chloride: 100 mmol/L (ref 98–111)
GFR calc Af Amer: 14 mL/min — ABNORMAL LOW (ref >60)
GFR, EST NON AFRICAN AMERICAN: 12 mL/min — AB (ref >60)
GLUCOSE: 97 mg/dL (ref 70–99)
POTASSIUM: 4.2 mmol/L (ref 3.5–5.1)
Sodium: 132 mmol/L — ABNORMAL LOW (ref 135–145)
TOTAL PROTEIN: 7.1 g/dL (ref 6.5–8.1)
Total Bilirubin: 1 mg/dL (ref 0.3–1.2)

## 2018-01-08 LAB — TROPONIN I: TROPONIN I: 0.1 ng/mL — AB (ref <0.03)

## 2018-01-08 LAB — CBC
HEMATOCRIT: 37.7 % — AB (ref 39.0–52.0)
Hemoglobin: 12.1 g/dL — ABNORMAL LOW (ref 13.0–17.0)
MCH: 30.6 pg (ref 26.0–34.0)
MCHC: 32.1 g/dL (ref 30.0–36.0)
MCV: 95.4 fL (ref 80.0–100.0)
NRBC: 0.2 % (ref 0.0–0.2)
Platelets: 210 10*3/uL (ref 150–400)
RBC: 3.95 MIL/uL — AB (ref 4.22–5.81)
RDW: 13.2 % (ref 11.5–15.5)
WBC: 17.4 10*3/uL — AB (ref 4.0–10.5)

## 2018-01-08 LAB — CG4 I-STAT (LACTIC ACID)
Lactic Acid, Venous: 6.1 mmol/L (ref 0.5–1.9)
Lactic Acid, Venous: 1.57 mmol/L (ref 0.5–1.9)

## 2018-01-08 LAB — MRSA PCR SCREENING: MRSA by PCR: NEGATIVE

## 2018-01-08 LAB — LIPASE, BLOOD: Lipase: 18 U/L (ref 11–51)

## 2018-01-08 MED ORDER — ALPRAZOLAM 0.25 MG PO TABS
0.2500 mg | ORAL_TABLET | Freq: Two times a day (BID) | ORAL | Status: DC
  Administered 2018-01-08: 0.25 mg via ORAL
  Filled 2018-01-08: qty 1

## 2018-01-08 MED ORDER — SODIUM CHLORIDE 0.9 % IV SOLN
2.0000 g | Freq: Once | INTRAVENOUS | Status: AC
  Administered 2018-01-08: 2 g via INTRAVENOUS
  Filled 2018-01-08: qty 2

## 2018-01-08 MED ORDER — ENOXAPARIN SODIUM 40 MG/0.4ML ~~LOC~~ SOLN: 40.0000 mg | SUBCUTANEOUS | Status: DC

## 2018-01-08 MED ORDER — SODIUM CHLORIDE 0.9 % IV SOLN
1.0000 g | Freq: Two times a day (BID) | INTRAVENOUS | Status: DC
  Administered 2018-01-08: 1 g via INTRAVENOUS
  Filled 2018-01-08 (×3): qty 1

## 2018-01-08 MED ORDER — IOPAMIDOL (ISOVUE-300) INJECTION 61%
30.0000 mL | Freq: Once | INTRAVENOUS | Status: AC | PRN
  Administered 2018-01-08: 30 mL via ORAL

## 2018-01-08 MED ORDER — LORAZEPAM 2 MG/ML IJ SOLN
2.0000 mg | Freq: Four times a day (QID) | INTRAMUSCULAR | Status: DC | PRN
  Filled 2018-01-08: qty 1

## 2018-01-08 MED ORDER — METOPROLOL TARTRATE 5 MG/5ML IV SOLN
5.0000 mg | Freq: Four times a day (QID) | INTRAVENOUS | Status: DC
  Administered 2018-01-08: 5 mg via INTRAVENOUS
  Filled 2018-01-08: qty 5

## 2018-01-08 MED ORDER — BUSPIRONE HCL 5 MG PO TABS
5.0000 mg | ORAL_TABLET | Freq: Two times a day (BID) | ORAL | Status: DC
  Administered 2018-01-08: 5 mg via ORAL
  Filled 2018-01-08 (×8): qty 1

## 2018-01-08 MED ORDER — ASPIRIN EC 81 MG PO TBEC: 81.0000 mg | DELAYED_RELEASE_TABLET | ORAL | Status: DC

## 2018-01-08 MED ORDER — SODIUM CHLORIDE (HYPERTONIC) 5 % OP SOLN
1.0000 [drp] | Freq: Three times a day (TID) | OPHTHALMIC | Status: DC
  Filled 2018-01-08: qty 15

## 2018-01-08 MED ORDER — METRONIDAZOLE IN NACL 5-0.79 MG/ML-% IV SOLN
500.0000 mg | Freq: Three times a day (TID) | INTRAVENOUS | Status: DC
  Administered 2018-01-08 – 2018-01-09 (×3): 500 mg via INTRAVENOUS
  Filled 2018-01-08 (×6): qty 100

## 2018-01-08 MED ORDER — ONDANSETRON HCL 4 MG PO TABS: 4.0000 mg | ORAL_TABLET | Freq: Four times a day (QID) | ORAL | Status: DC | PRN

## 2018-01-08 MED ORDER — HYDROCODONE-ACETAMINOPHEN 5-325 MG PO TABS
1.0000 | ORAL_TABLET | ORAL | Status: DC | PRN
  Administered 2018-01-08: 2 via ORAL
  Filled 2018-01-08: qty 2

## 2018-01-08 MED ORDER — MORPHINE SULFATE (PF) 2 MG/ML IV SOLN
2.0000 mg | INTRAVENOUS | Status: DC | PRN
  Administered 2018-01-09 – 2018-01-10 (×4): 2 mg via INTRAVENOUS
  Filled 2018-01-08 (×4): qty 1

## 2018-01-08 MED ORDER — TAMSULOSIN HCL 0.4 MG PO CAPS: 0.4000 mg | ORAL_CAPSULE | Freq: Every day | ORAL | Status: DC

## 2018-01-08 MED ORDER — PHENOL 1.4 % MT LIQD
1.0000 | OROMUCOSAL | Status: DC | PRN
  Filled 2018-01-08: qty 177

## 2018-01-08 MED ORDER — AMLODIPINE BESYLATE 5 MG PO TABS
5.0000 mg | ORAL_TABLET | Freq: Every day | ORAL | Status: DC
  Administered 2018-01-08: 5 mg via ORAL
  Filled 2018-01-08: qty 1

## 2018-01-08 MED ORDER — PREDNISOLONE ACETATE 1 % OP SUSP
1.0000 [drp] | Freq: Three times a day (TID) | OPHTHALMIC | Status: DC
  Filled 2018-01-08: qty 1

## 2018-01-08 MED ORDER — ONDANSETRON HCL 4 MG/2ML IJ SOLN
4.0000 mg | Freq: Four times a day (QID) | INTRAMUSCULAR | Status: DC | PRN
  Administered 2018-01-08: 4 mg via INTRAVENOUS
  Filled 2018-01-08: qty 2

## 2018-01-08 MED ORDER — ACETAMINOPHEN 650 MG RE SUPP: 650.0000 mg | Freq: Four times a day (QID) | RECTAL | Status: DC | PRN

## 2018-01-08 MED ORDER — VITAMIN D3 25 MCG (1000 UNIT) PO TABS
2000.0000 [IU] | ORAL_TABLET | Freq: Every day | ORAL | Status: DC
  Administered 2018-01-08: 2000 [IU] via ORAL
  Filled 2018-01-08 (×5): qty 2

## 2018-01-08 MED ORDER — SODIUM CHLORIDE 0.9 % IV BOLUS
1000.0000 mL | Freq: Once | INTRAVENOUS | Status: AC
  Administered 2018-01-08: 1000 mL via INTRAVENOUS

## 2018-01-08 MED ORDER — ALPRAZOLAM 0.25 MG PO TABS: 0.2500 mg | ORAL_TABLET | Freq: Every evening | ORAL | Status: DC | PRN

## 2018-01-08 MED ORDER — TRIAMCINOLONE ACETONIDE 0.1 % EX CREA
1.0000 "application " | TOPICAL_CREAM | Freq: Two times a day (BID) | CUTANEOUS | Status: DC | PRN
  Filled 2018-01-08: qty 15

## 2018-01-08 MED ORDER — SODIUM CHLORIDE 0.9 % IV SOLN
INTRAVENOUS | Status: DC
  Administered 2018-01-08 – 2018-01-09 (×2): via INTRAVENOUS

## 2018-01-08 MED ORDER — VANCOMYCIN HCL IN DEXTROSE 1-5 GM/200ML-% IV SOLN
1000.0000 mg | Freq: Once | INTRAVENOUS | Status: AC
  Administered 2018-01-08: 1000 mg via INTRAVENOUS
  Filled 2018-01-08: qty 200

## 2018-01-08 MED ORDER — ACETAMINOPHEN 325 MG PO TABS: 650.0000 mg | ORAL_TABLET | Freq: Four times a day (QID) | ORAL | Status: DC | PRN

## 2018-01-08 MED ORDER — VANCOMYCIN HCL IN DEXTROSE 750-5 MG/150ML-% IV SOLN
750.0000 mg | INTRAVENOUS | Status: DC
  Filled 2018-01-08: qty 150

## 2018-01-08 NOTE — H&P (Signed)
[employer] -  at [facility]   PATIENT NAME: Joel Charles    MR#:  [id number]  DATE OF BIRTH:  01/10/1927  DATE OF ADMISSION:  01/08/2018  PRIMARY CARE PHYSICIAN: [facility]   REQUESTING/REFERRING PHYSICIAN:  Minna AntisPaduchowski, Kevin, MD  CHIEF COMPLAINT:   Chief Complaint  Patient presents with  . Altered Mental Status    HISTORY OF PRESENT ILLNESS: Joel Charles  is a 84 y.o. male with a known history of anxiety dementia hypertension who was brought after he was found down in [place].  Initially there was concern for sepsis.  Patient's blood pressure was noted to be in the 80s.  Patient with his dementia unable to give any history.  He is currently denying any abdominal pain except when his abdomen is pressed.  Evaluation in the ED showed patient has severe ischemic colitis involving his small bowel and large bowel.  He was seen by surgery they talked to the family and based on his age and dementia they did not feel that he would be a surgical candidate. PAST MEDICAL HISTORY:   Past Medical History:  Diagnosis Date  . Anxiety   . Dementia (HCC)   . Hypertension   . Tuberculosis     PAST SURGICAL HISTORY:  Past Surgical History:  Procedure Laterality Date  . APPENDECTOMY    . JOINT REPLACEMENT     bilateral knee    SOCIAL HISTORY:  Social History   Tobacco Use  . Smoking status: Never Smoker  . Smokeless tobacco: Never Used  Substance Use Topics  . Alcohol use: No    FAMILY HISTORY: No family history on file.  DRUG ALLERGIES: No Known Allergies  REVIEW OF SYSTEMS:   CONSTITUTIONAL unable to provide MEDICATIONS AT HOME:  Prior to Admission medications   Medication Sig Start Date End Date Taking? Authorizing Provider  ALPRAZolam (XANAX) 0.25 MG tablet Take 0.25 mg by mouth 2 (two) times daily.    Yes [provider]  ALPRAZolam (XANAX) 0.25 MG tablet Take 0.25 mg by mouth at bedtime as needed for anxiety or  sleep.    Yes [provider]  amLODipine (NORVASC) 5 MG tablet Take 5 mg by mouth daily.   Yes [provider]  aspirin EC 81 MG tablet Take 81 mg by mouth every other day.   Yes [provider]  busPIRone (BUSPAR) 5 MG tablet Take 5 mg by mouth 2 (two) times daily.   Yes [provider]  cholecalciferol (VITAMIN D) 1000 units tablet Take 2,000 Units by mouth daily.   Yes [provider]  hydrochlorothiazide (HYDRODIURIL) 25 MG tablet Take 25 mg by mouth daily.   Yes [provider]  mineral oil liquid Instill 2 drops in both ears every Monday morning   Yes [provider]  polyethylene glycol (MIRALAX / GLYCOLAX) packet Take 17 g by mouth daily as needed for mild constipation or moderate constipation.   Yes [provider]  prednisoLONE acetate (PRED FORTE) 1 % ophthalmic suspension Place 1 drop into both eyes 3 (three) times daily.   Yes [provider]  sodium chloride (MURO 128) 5 % ophthalmic solution Place 1 drop into both eyes 3 (three) times daily.   Yes [provider]  tamsulosin (FLOMAX) 0.4 MG CAPS capsule Take 0.4 mg by mouth daily.   Yes [provider]  triamcinolone cream (KENALOG) 0.1 % Apply 1 application topically 2 (two) times daily as needed (for itching).  Yes [provider]  azithromycin (ZITHROMAX Z-PAK) 250 MG tablet Take 2 tablets (500 mg) on  Day 1,  followed by 1 tablet (250 mg) once daily on Days 2 through 5. Patient not taking: Reported on 11/04/2017 09/05/17   Emily FilbertWilliams, Jonathan E, MD      PHYSICAL EXAMINATION:   VITAL SIGNS: Blood pressure (!) 144/69, pulse 88, temperature 97.9 F (36.6 C), temperature source Oral, resp. rate (!) 22, height 6' (1.829 m), weight 83.7 kg, SpO2 95 %.  GENERAL:  84 y.o.-year-old patient lying in the bed with no acute distress.  EYES: Pupils equal, round, reactive to light and accommodation. No scleral icterus. Extraocular  muscles intact.  HEENT: Head atraumatic, normocephalic. Oropharynx and nasopharynx clear.  NECK:  Supple, no jugular venous distention. No thyroid enlargement, no tenderness.  LUNGS: Normal breath sounds bilaterally, no wheezing, rales,rhonchi or crepitation. No use of accessory muscles of respiration.  CARDIOVASCULAR: S1, S2 normal. No murmurs, rubs, or gallops.  ABDOMEN: Soft, distended positive tenderness but no guarding EXTREMITIES: No pedal edema, cyanosis, or clubbing.  NEUROLOGIC: Cranial nerves II through XII are intact. Muscle strength 5/5 in all extremities. Sensation intact. Gait not checked.  PSYCHIATRIC: The patient is awake but not oriented  sKIN: No obvious rash, lesion, or ulcer.   LABORATORY PANEL:   CBC Recent Labs  Lab 01/08/18 0852  WBC 17.4*  HGB 12.1*  HCT 37.7*  PLT 210  MCV 95.4  MCH 30.6  MCHC 32.1  RDW 13.2   ------------------------------------------------------------------------------------------------------------------  Chemistries  Recent Labs  Lab 01/08/18 0852  NA 132*  K 4.2  CL 100  CO2 12*  GLUCOSE 97  BUN 74*  CREATININE 4.03*  CALCIUM 7.9*  AST 116*  ALT 57*  ALKPHOS 63  BILITOT 1.0   ------------------------------------------------------------------------------------------------------------------ estimated creatinine clearance is 13.6 mL/min (A) (by C-G formula based on SCr of 4.03 mg/dL (H)). ------------------------------------------------------------------------------------------------------------------ No results for input(s): TSH, T4TOTAL, T3FREE, THYROIDAB in the last 72 hours.  Invalid input(s): FREET3   Coagulation profile No results for input(s): INR, PROTIME in the last 168 hours. ------------------------------------------------------------------------------------------------------------------- No results for input(s): DDIMER in the last 72  hours. -------------------------------------------------------------------------------------------------------------------  Cardiac Enzymes Recent Labs  Lab 01/08/18 0852  TROPONINI 0.10*   ------------------------------------------------------------------------------------------------------------------ Invalid input(s): POCBNP  ---------------------------------------------------------------------------------------------------------------  Urinalysis    Component Value Date/Time   COLORURINE YELLOW (A) 11/04/2017 0413   APPEARANCEUR CLEAR (A) 11/04/2017 0413   LABSPEC 1.015 11/04/2017 0413   PHURINE 5.0 11/04/2017 0413   GLUCOSEU NEGATIVE 11/04/2017 0413   HGBUR NEGATIVE 11/04/2017 0413   BILIRUBINUR NEGATIVE 11/04/2017 0413   KETONESUR NEGATIVE 11/04/2017 0413   PROTEINUR NEGATIVE 11/04/2017 0413   NITRITE NEGATIVE 11/04/2017 0413   LEUKOCYTESUR NEGATIVE 11/04/2017 0413     RADIOLOGY: Ct Abdomen Pelvis Wo Contrast  Result Date: 01/08/2018 CLINICAL DATA:  Abdominal pain and diarrhea EXAM: CT ABDOMEN AND PELVIS WITHOUT CONTRAST TECHNIQUE: Multidetector CT imaging of the abdomen and pelvis was performed following the standard protocol without IV contrast. COMPARISON:  None. FINDINGS: Lower chest: Scattered calcifications are noted in the lung bases. Some chronic fibrotic changes are seen. No focal infiltrate is noted. Hepatobiliary: The liver is well visualized. Peripheral air densities are noted primarily in the left lobe consistent with portal venous air. Considerable air is noted within the left portal vein. The gallbladder is within normal limits. Pancreas: Unremarkable. No pancreatic ductal dilatation or surrounding inflammatory changes. Spleen: Normal in size without focal abnormality. Adrenals/Urinary Tract: Adrenal glands are within normal limits bilaterally.  Kidneys are well visualized bilaterally without renal calculi or obstructive changes. The bladder is partially  distended. Stomach/Bowel: Diverticular change of the sigmoid colon is noted. Some diffuse wall thickening is seen within the transverse colon and ascending colon with evidence of perforation and pericolonic air along the course of the ascending colon. The appendix has been surgically removed. Terminal ileum appears within normal limits. The proximal and mid ileum however show changes of pneumatosis and mild dilatation. No definitive transition zone is seen. Stomach is well distended with oral contrast material. Vascular/Lymphatic: Diffuse aortic calcifications are noted. No significant lymphadenopathy is seen. Reproductive: Prostate is unremarkable. Other: Mild free fluid is noted within the pelvis and mid abdomen. Musculoskeletal: Degenerative changes of the lumbar spine are noted. IMPRESSION: There are changes of small-bowel dilatation with distal ileal pneumatosis as well as changes of wall thickening and pericolonic air in the ascending and transverse colons. These changes are highly suspicious for ischemia of the large and small bowel given its distribution. Proximal jejunal dilatation is noted in a compensatory manner. Associated portal venous air is seen within the left portal vein as well as the portal branches within the left lobe. Critical Value/emergent results were called by telephone at the time of interpretation on 01/08/2018 at 10:58 am to Dr. Minna Antis , who verbally acknowledged these results. Electronically Signed   By: Alcide Clever M.D.   On: 01/08/2018 11:00   Dg Chest Port 1 View  Result Date: 01/08/2018 CLINICAL DATA:  Sepsis. History of tuberculosis. EXAM: PORTABLE CHEST 1 VIEW COMPARISON:  09/10/2017. FINDINGS: Stable borderline enlarged cardiac silhouette. Decreased inspiration with stable chronic prominence of the interstitial markings and right apical pleural and parenchymal scarring. No superimposed airspace opacity. Diffuse osteopenia. Previously demonstrated left shoulder  degenerative changes with bony remodeling. IMPRESSION: No acute finding. Stable chronic interstitial lung disease. Electronically Signed   By: Beckie Salts M.D.   On: 01/08/2018 09:36    EKG: Orders placed or performed during the hospital encounter of 01/08/18  . ED EKG 12-Lead  . ED EKG 12-Lead    IMPRESSION AND PLAN: Patient is a 84 year old with dementia presenting with sepsis-like picture  1.  Sepsis related to ischemic colitis involving the small and large bowel IV antibiotics and supportive care as below  2.  Ischemic colitis seen by surgery they feel high risk patient no surgery planned recommended palliative care and supportive care I discussed with the daughter initially we will treat with IV fluids and antibiotics if condition worsens transition to comfort care-daughter in agreement with this plan  3.  Hypertension continue amlodipine hold HCTZ  4.  Dementia continue Xanax for anxiety as needed as well as of BuSpar  5.  BPH continue Flomax  6.  Miscellaneous Lovenox for DVT prophylaxis  All the records are reviewed and case discussed with ED provider. Management plans discussed with the patient, family and they are in agreement.  CODE STATUS: DNR Advance Directive Documentation     Most Recent Value  Type of Advance Directive  Living will, Healthcare Power of Attorney  Pre-existing out of facility DNR order (yellow form or pink MOST form)  -  "MOST" Form in Place?  -       TOTAL TIME TAKING CARE OF THIS PATIENT: 55 minutes.    Auburn Bilberry M.D on 01/08/2018 at 11:33 AM  Between 7am to 6pm - Pager - [phone]  After 6pm go to [email or web address] - Social research officer, government  [employer] Office  [phone]  CC: Primary care physician; [facility]

## 2018-01-08 NOTE — Care Management (Signed)
[facility] Notified that [facility] is [facility].  Urban Gibsonim Green with hospice notified of admission.

## 2018-01-08 NOTE — Progress Notes (Signed)
[employer] - [place] at [facility]                                                                                                                                                                                  Patient Demographics   Joel Charles, is a 84 y.o. male, DOB - 07/11/1926, ZOX:[id number]  Admit date - 01/08/2018   Admitting Physician Auburn Bilberry, MD  Outpatient Primary MD for the patient is [facility]   LOS - 0  Subjective:  Called by nurse to evaluate the patient.  He was having significant emesis subsequently patient started having respiratory distress NG tube was placed and 2300 cc of brown liquid was removed Patient started becoming more agitated  Review of Systems:   CONSTITUTIONAL: Unable to provide   Vitals:   Vitals:   01/08/18 1130 01/08/18 1200 01/08/18 1208 01/08/18 1249  BP: 137/63 (!) 115/56  (!) 111/54  Pulse: 89 87  87  Resp: (!) 25  (!) 23   Temp:    97.6 F (36.4 C)  TempSrc:    Oral  SpO2: 94% 94%  99%  Weight:      Height:        Wt Readings from Last 3 Encounters:  01/08/18 83.7 kg  09/05/17 89.8 kg  11/12/16 89.8 kg     Intake/Output Summary (Last 24 hours) at 01/08/2018 1848 Last data filed at 01/08/2018 1800 Gross per 24 hour  Intake 2572.97 ml  Output 3000 ml  Net -427.03 ml    Physical Exam:   GENERAL: Acutely ill-appearing HEAD, EYES, EARS, NOSE AND THROAT: Atraumatic, normocephalic. Extraocular muscles are intact. Pupils equal and reactive to light. Sclerae anicteric. No conjunctival injection. No oro-pharyngeal erythema.  NECK: Supple. There is no jugular venous distention. No bruits, no lymphadenopathy, no thyromegaly.  HEART: Regular rate and rhythm,. No murmurs, no rubs, no clicks.  LUNGS: Clear to auscultation bilaterally. No rales or rhonchi. No wheezes.  ABDOMEN: Distended EXTREMITIES: No evidence of any cyanosis, clubbing, or peripheral edema.  +2 pedal and radial pulses  bilaterally.  NEUROLOGIC: Confused SKIN: Moist and warm with no rashes appreciated.  Psych: Confused LN: No inguinal LN enlargement    Antibiotics   Anti-infectives (From admission, onward)   Start     Dose/Rate Route Frequency Ordered Stop   01/08/18 2200  ceFEPIme (MAXIPIME) 1 g in sodium chloride 0.9 % 100 mL IVPB     1 g 200 mL/hr over 30 Minutes Intravenous Every 12 hours 01/08/18 1055     01/08/18 1700  vancomycin (VANCOCIN) IVPB 750 mg/150 ml premix  Status:  Discontinued     750 mg 150 mL/hr  over 60 Minutes Intravenous Every 36 hours 01/08/18 1055 01/08/18 1250   01/08/18 0930  ceFEPIme (MAXIPIME) 2 g in sodium chloride 0.9 % 100 mL IVPB     2 g 200 mL/hr over 30 Minutes Intravenous  Once 01/08/18 0919 01/08/18 1053   01/08/18 0930  metroNIDAZOLE (FLAGYL) IVPB 500 mg     500 mg 100 mL/hr over 60 Minutes Intravenous Every 8 hours 01/08/18 0919     01/08/18 0930  vancomycin (VANCOCIN) IVPB 1000 mg/200 mL premix     1,000 mg 200 mL/hr over 60 Minutes Intravenous  Once 01/08/18 0919 01/08/18 1129      Medications   Scheduled Meds: . ALPRAZolam  0.25 mg Oral BID  . amLODipine  5 mg Oral Daily  . [START ON 01/09/2018] aspirin EC  81 mg Oral QODAY  . busPIRone  5 mg Oral BID  . cholecalciferol  2,000 Units Oral Daily  . enoxaparin (LOVENOX) injection  40 mg Subcutaneous Q24H  . metoprolol tartrate  5 mg Intravenous Q6H  . prednisoLONE acetate  1 drop Both Eyes TID  . sodium chloride  1 drop Both Eyes TID  . [START ON 01/09/2018] tamsulosin  0.4 mg Oral Daily   Continuous Infusions: . sodium chloride 75 mL/hr at 01/08/18 1710  . ceFEPime (MAXIPIME) IV    . metronidazole 500 mg (01/08/18 1720)   PRN Meds:.acetaminophen **OR** acetaminophen, ALPRAZolam, HYDROcodone-acetaminophen, LORazepam, morphine injection, ondansetron **OR** ondansetron (ZOFRAN) IV, phenol, triamcinolone cream   Data Review:   Micro Results Recent Results (from the past 240 hour(s))  MRSA PCR  Screening     Status: None   Collection Time: 01/08/18  4:11 PM  Result Value Ref Range Status   MRSA by PCR NEGATIVE NEGATIVE Final    Comment:        The GeneXpert MRSA Assay (FDA approved for NASAL specimens only), is one component of a comprehensive MRSA colonization surveillance program. It is not intended to diagnose MRSA infection nor to guide or monitor treatment for MRSA infections. Performed at [facility] [address] [place], [place] [address]     Radiology Reports Ct Abdomen Pelvis Wo Contrast  Result Date: 01/08/2018 CLINICAL DATA:  Abdominal pain and diarrhea EXAM: CT ABDOMEN AND PELVIS WITHOUT CONTRAST TECHNIQUE: Multidetector CT imaging of the abdomen and pelvis was performed following the standard protocol without IV contrast. COMPARISON:  None. FINDINGS: Lower chest: Scattered calcifications are noted in the lung bases. Some chronic fibrotic changes are seen. No focal infiltrate is noted. Hepatobiliary: The liver is well visualized. Peripheral air densities are noted primarily in the left lobe consistent with portal venous air. Considerable air is noted within the left portal vein. The gallbladder is within normal limits. Pancreas: Unremarkable. No pancreatic ductal dilatation or surrounding inflammatory changes. Spleen: Normal in size without focal abnormality. Adrenals/Urinary Tract: Adrenal glands are within normal limits bilaterally. Kidneys are well visualized bilaterally without renal calculi or obstructive changes. The bladder is partially distended. Stomach/Bowel: Diverticular change of the sigmoid colon is noted. Some diffuse wall thickening is seen within the transverse colon and ascending colon with evidence of perforation and pericolonic air along the course of the ascending colon. The appendix has been surgically removed. Terminal ileum appears within normal limits. The proximal and mid ileum however show changes of pneumatosis and mild dilatation.  No definitive transition zone is seen. Stomach is well distended with oral contrast material. Vascular/Lymphatic: Diffuse aortic calcifications are noted. No significant lymphadenopathy is seen. Reproductive: Prostate  is unremarkable. Other: Mild free fluid is noted within the pelvis and mid abdomen. Musculoskeletal: Degenerative changes of the lumbar spine are noted. IMPRESSION: There are changes of small-bowel dilatation with distal ileal pneumatosis as well as changes of wall thickening and pericolonic air in the ascending and transverse colons. These changes are highly suspicious for ischemia of the large and small bowel given its distribution. Proximal jejunal dilatation is noted in a compensatory manner. Associated portal venous air is seen within the left portal vein as well as the portal branches within the left lobe. Critical Value/emergent results were called by telephone at the time of interpretation on 01/08/2018 at 10:58 am to Dr. Minna AntisKEVIN PADUCHOWSKI , who verbally acknowledged these results. Electronically Signed   By: Alcide CleverMark  Lukens M.D.   On: 01/08/2018 11:00   Dg Chest 1 View  Result Date: 01/08/2018 CLINICAL DATA:  Possible aspiration pneumonitis, nasogastric tube EXAM: CHEST  1 VIEW COMPARISON:  Portable exam 1628 hours compared to 01/08/2018 FINDINGS: Nasogastric tube extends into stomach. Upper normal size of cardiac silhouette. Mediastinal contours and pulmonary vascularity normal. Extensive RIGHT upper lobe scarring. Interstitial prominence in the mid to lower lungs again identified with subsegmental atelectasis at both lung bases. No definite acute infiltrate, pleural effusion or pneumothorax. Mild central peribronchial thickening noted. Bones demineralized with multiple LEFT rib fractures and advanced degenerative changes of the LEFT shoulder joint. IMPRESSION: Chronic bronchitic and interstitial disease changes with bibasilar atelectasis and RIGHT upper lobe scarring. No definite acute  infiltrate. Electronically Signed   By: Ulyses SouthwardMark  Boles M.D.   On: 01/08/2018 16:44   Dg Chest Port 1 View  Result Date: 01/08/2018 CLINICAL DATA:  Sepsis. History of tuberculosis. EXAM: PORTABLE CHEST 1 VIEW COMPARISON:  09/10/2017. FINDINGS: Stable borderline enlarged cardiac silhouette. Decreased inspiration with stable chronic prominence of the interstitial markings and right apical pleural and parenchymal scarring. No superimposed airspace opacity. Diffuse osteopenia. Previously demonstrated left shoulder degenerative changes with bony remodeling. IMPRESSION: No acute finding. Stable chronic interstitial lung disease. Electronically Signed   By: Beckie SaltsSteven  Reid M.D.   On: 01/08/2018 09:36     CBC Recent Labs  Lab 01/08/18 0852  WBC 17.4*  HGB 12.1*  HCT 37.7*  PLT 210  MCV 95.4  MCH 30.6  MCHC 32.1  RDW 13.2    Chemistries  Recent Labs  Lab 01/08/18 0852  NA 132*  K 4.2  CL 100  CO2 12*  GLUCOSE 97  BUN 74*  CREATININE 4.03*  CALCIUM 7.9*  AST 116*  ALT 57*  ALKPHOS 63  BILITOT 1.0   ------------------------------------------------------------------------------------------------------------------ estimated creatinine clearance is 13.6 mL/min (A) (by C-G formula based on SCr of 4.03 mg/dL (H)). ------------------------------------------------------------------------------------------------------------------ No results for input(s): HGBA1C in the last 72 hours. ------------------------------------------------------------------------------------------------------------------ No results for input(s): CHOL, HDL, LDLCALC, TRIG, CHOLHDL, LDLDIRECT in the last 72 hours. ------------------------------------------------------------------------------------------------------------------ No results for input(s): TSH, T4TOTAL, T3FREE, THYROIDAB in the last 72 hours.  Invalid input(s):  FREET3 ------------------------------------------------------------------------------------------------------------------ No results for input(s): VITAMINB12, FOLATE, FERRITIN, TIBC, IRON, RETICCTPCT in the last 72 hours.  Coagulation profile No results for input(s): INR, PROTIME in the last 168 hours.  No results for input(s): DDIMER in the last 72 hours.  Cardiac Enzymes Recent Labs  Lab 01/08/18 0852  TROPONINI 0.10*   ------------------------------------------------------------------------------------------------------------------ Invalid input(s): POCBNP    Assessment & Plan   Patient is 84 year old admitted with ischemic colitis  1.  Respiratory distress patient's chest x-ray shows no evidence of aspiration Continue current antibiotics for call ischemic  colitis I discussed with the patient's daughter regarding overall poor prognosis especially now with small bowel obstruction related to his ischemic colitis, explained her that his prognosis is very poor high chance that he will not survive this.  Offered her comfort measures.  She states that she would like to see how he will do overnight.  If no improvement by tomorrow she would consider comfort measures.      Code Status Orders  (From admission, onward)         Start     Ordered   01/08/18 1251  Full code  Continuous     01/08/18 1250        Code Status History    Date Active Date Inactive Code Status Order ID Comments User Context   01/08/2018 1150 01/08/2018 1250 DNR [id number]  Auburn BilberryPatel, Keyuana Wank, MD ED    Advance Directive Documentation     Most Recent Value  Type of Advance Directive  Living will, Healthcare Power of Attorney  Pre-existing out of facility DNR order (yellow form or pink MOST form)  -  "MOST" Form in Place?  -           Consults surgery    Lab Results  Component Value Date   PLT 210 01/08/2018     Time Spent in minutes additional 45 minutes spent greater than 50% of time  spent in care coordination and counseling patient regarding the condition and plan of care.   Auburn BilberryShreyang Austyn Perriello M.D on 01/08/2018 at 6:48 PM  Between 7am to 6pm - Pager - [phone]  After 6pm go to [email or web address] - Social research officer, governmentpassword EPAS [email or web address]  [employer]   Office  [phone]

## 2018-01-08 NOTE — Progress Notes (Signed)
Pharmacy Antibiotic Note  Joel Charles is a 84 y.o. male admitted on 01/08/2018 with sepsis.  Pharmacy has been consulted for vancomycin and cefepime dosing. Patient is also receiving metronidazole.  Patient with SCr 4.03 on admission. SCr 1.41 11/04/17.  Plan: Vancomycin 750 mg IV q36h to start at 1700. Goal trough 15-20 mcg/ml. Will follow renal function and obtain level as clinically indicated. ke 0.016, t1/2 43.3 hr, VD 54.3 L  Cefepime 1 g IV q12h  Height: 6' (182.9 cm) Weight: 184 lb 9.6 oz (83.7 kg) IBW/kg (Calculated) : 77.6  Temp (24hrs), Avg:97.9 F (36.6 C), Min:97.9 F (36.6 C), Max:97.9 F (36.6 C)  Recent Labs  Lab 01/08/18 0852 01/08/18 0911  WBC 17.4*  --   CREATININE 4.03*  --   LATICACIDVEN  --  6.10*    Estimated Creatinine Clearance: 13.6 mL/min (A) (by C-G formula based on SCr of 4.03 mg/dL (H)).    No Known Allergies  Antimicrobials this admission: Vancomycin 12/20 >>  Cefepime 12/20 >>  Metronidazole 12/20 >>  Dose adjustments this admission: NA  Microbiology results: 12/20 BCx: pending 12/20 UCx: pending  12/20 C. Diff: pending 12/20 GI panel: pending  Thank you for allowing pharmacy to be a part of this patient's care.  Pricilla RiffleAbby K Jaquavian Firkus, PharmD Pharmacy Resident  01/08/2018 10:51 AM

## 2018-01-08 NOTE — ED Provider Notes (Signed)
[facility] [place] Emergency Department Provider Note  Time seen: 8:40 AM  I have reviewed the triage vital signs and the nursing notes.   HISTORY  Chief Complaint Altered Mental Status    HPI Joel Charles is a 84 y.o. male with a past medical history of dementia, anxiety, hypertension presents to the emergency department via emergency traffic for possible sepsis.  According to EMS the patient was found down in his room at [facility].  They state initially the patient's blood pressure was low in the 80s systolic and per staff patient appeared to be more confused than normal.  Upon arrival EMS states the blood pressure had increased to around 108 systolic, patient is currently alert and oriented.  Is able to tell me what hospital he is at is able to tell me the year as well as his name.  Patient has no complaints at this time.  Has had diarrhea over the past 2 to 3 days.  No reported fever by EMS.  No known fever.  Largely negative review of systems per patient.   Past Medical History:  Diagnosis Date  . Anxiety   . Dementia (HCC)   . Hypertension   . Tuberculosis     There are no active problems to display for this patient.   Past Surgical History:  Procedure Laterality Date  . APPENDECTOMY    . JOINT REPLACEMENT     bilateral knee    Prior to Admission medications   Medication Sig Start Date End Date Taking? Authorizing Provider  ALPRAZolam (XANAX) 0.25 MG tablet Take 0.25 mg by mouth 2 (two) times daily.     [provider]  ALPRAZolam Prudy Feeler) 0.25 MG tablet Take 0.25 mg by mouth at bedtime as needed for anxiety or sleep.     [provider]  amLODipine (NORVASC) 5 MG tablet Take 5 mg by mouth daily.    [provider]  aspirin EC 81 MG tablet Take 81 mg by mouth every other day.    [provider]  azithromycin (ZITHROMAX Z-PAK) 250 MG tablet Take 2 tablets (500 mg) on  Day 1,  followed by 1 tablet (250 mg) once daily  on Days 2 through 5. Patient not taking: Reported on 11/04/2017 09/05/17   Emily Filbert, MD  busPIRone (BUSPAR) 5 MG tablet Take 5 mg by mouth 2 (two) times daily.    [provider]  cholecalciferol (VITAMIN D) 1000 units tablet Take 2,000 Units by mouth daily.    [provider]  hydrochlorothiazide (HYDRODIURIL) 25 MG tablet Take 25 mg by mouth daily.    [provider]  mineral oil liquid Instill 2 drops in both ears every Monday morning    [provider]  polyethylene glycol (MIRALAX / GLYCOLAX) packet Take 17 g by mouth daily as needed for mild constipation or moderate constipation.    [provider]  prednisoLONE acetate (PRED FORTE) 1 % ophthalmic suspension Place 1 drop into both eyes 3 (three) times daily.    [provider]  sodium chloride (MURO 128) 5 % ophthalmic solution Place 1 drop into both eyes 3 (three) times daily.    [provider]  tamsulosin (FLOMAX) 0.4 MG CAPS capsule Take 0.4 mg by mouth daily.    [provider]  triamcinolone cream (KENALOG) 0.1 % Apply 1 application topically 2 (two) times daily as needed (for itching).     [provider]    No Known Allergies  No family history on file.  Social History Social History   Tobacco Use  . Smoking status: Never Smoker  . Smokeless tobacco: Never Used  Substance Use Topics  . Alcohol use: No  . Drug use: No    Review of Systems, possibly limited by mild dementia. Constitutional: Negative for fever. Cardiovascular: Negative for chest pain. Respiratory: Negative for shortness of breath. Gastrointestinal: Negative for abdominal pain.  Positive for diarrhea.  Negative for vomiting. Genitourinary: Negative for urinary compaints Musculoskeletal: Negative for musculoskeletal complaints Neurological: Negative for headache All other ROS negative  ____________________________________________   PHYSICAL  EXAM:  Constitutional: Alert and oriented.  No distress. Eyes: Normal exam ENT   Head: Normocephalic and atraumatic.   Mouth/Throat: Somewhat dry mucous membranes Cardiovascular: Normal rate, regular rhythm. No murmur Respiratory: Normal respiratory effort without tachypnea nor retractions. Breath sounds are clear Gastrointestinal: Moderate distention with tympanic percussion, mild diffuse tenderness without focal area of tenderness identified. Musculoskeletal: Nontender with normal range of motion in all extremities.  Neurologic:  Normal speech and language. No gross focal neurologic deficits Skin:  Skin is warm, dry and intact.  Psychiatric: Mood and affect are normal.   ____________________________________________    EKG  EKG viewed and interpreted by myself appears to show sinus rhythm at around 89 bpm with a narrow QRS appears to have a prolonged PR interval although not identified by computer reading.  Nonspecific ST changes occasional PVC.  ____________________________________________    RADIOLOGY  Chest x-ray negative  ____________________________________________   INITIAL IMPRESSION / ASSESSMENT AND PLAN / ED COURSE  Pertinent labs & imaging results that were available during my care of the patient were reviewed by me and considered in my medical decision making (see chart for details).  Patient presents to the emergency department via emergency traffic for altered mental status and weakness.  According to the nursing home staff per EMS patient was more confused over the past several days.  They also report intermittent diarrhea over the past 2 days.  Only significant finding on my examination is a patient does have mild diffuse abdominal tenderness with a moderately distended abdomen with tympanic percussion.  We will check labs, IV hydrate, obtain CT scan of the abdomen/pelvis to further evaluate.  At this time the patient appears overall well alert and oriented,  no fever in the emergency department.  Patient's lab work has begun to come back, lactic acid of 6.1 and his white blood cell count is 17,000.  Is very not entirely sure what is causing the patient's symptoms and he has a significantly elevated white blood cell count as well as lactic acid we will check blood cultures under sepsis protocols and start broad-spectrum antibiotics until the patient's clinical picture becomes more clear.  Patient's family is here with the patient, they state that the area of the nursing home that the patient lives in has been closed to visitors due to a GI illness which has been spreading from resident to resident.  They report diarrhea over the past several days at his nursing facility.  Patient's chemistry is resulted showing a significant elevation of his creatinine 4.0 with an anion gap of 20 suggesting significant dehydration, baseline creatinine appears to be 1.1-1.4.  We will continue with IV hydration.  We will obtain CT imaging of the abdomen without contrast.  We will send stool samples if the patient is able to produce a stool sample.  We will place on enteric precautions.  Patient will require admission to the  hospital once his work-up is been completed.  Family agreeable to plan of care.  CT scan unfortunately shows signs of ischemic bowel with pneumatosis.  I discussed the patient with general surgery who is currently speaking to the patient/family.  Regardless of outcome patient will require medical admission for resuscitation prior to any surgical intervention however family is leaning towards nonsurgical care given the patient's age and comorbidities as well as history of dementia.  We will admit to the hospital service continue IV antibiotics for now.  CRITICAL CARE Performed by: Minna AntisKevin Guliana Weyandt   Total critical care time: 45 minutes  Critical care time was exclusive of separately billable procedures and treating other patients.  Critical care was  necessary to treat or prevent imminent or life-threatening deterioration.  Critical care was time spent personally by me on the following activities: development of treatment plan with patient and/or surrogate as well as nursing, discussions with consultants, evaluation of patient's response to treatment, examination of patient, obtaining history from patient or surrogate, ordering and performing treatments and interventions, ordering and review of laboratory studies, ordering and review of radiographic studies, pulse oximetry and re-evaluation of patient's condition.   ____________________________________________   FINAL CLINICAL IMPRESSION(S) / ED DIAGNOSES  Dehydration Sepsis Ischemic bowel Pneumatosis    Minna AntisPaduchowski, Carlo Lorson, MD 01/08/18 1127

## 2018-01-08 NOTE — Progress Notes (Signed)
CODE SEPSIS - PHARMACY COMMUNICATION  **Broad Spectrum Antibiotics should be administered within 1 hour of Sepsis diagnosis**  Time Code Sepsis Called/Page Received: 0920  Antibiotics Ordered: vancomycin/cefepime/metronidazole  Time of 1st antibiotic administration: 1020  Additional action taken by pharmacy: Called with 15 min left in sepsis window at 1005  If necessary, Name of Provider/Nurse Contacted: Clementeen HoofStephanie Rudd, RN    Pricilla RiffleAbby K Ellington ,PharmD Clinical Pharmacist  01/08/2018  10:22 AM

## 2018-01-08 NOTE — ED Notes (Signed)
Pt started drinking oral contrast

## 2018-01-08 NOTE — Progress Notes (Signed)
Patient with sustained HR ranging from 100-150. Primary MD placed orders for PRN medications, will administer and monitor response. Telemetry in place. Patient has continuously vomited up the clear liquid tray since he ate,zofran given with some relief. Patient noted to have increase in crackles as well, MD order CXR to assess for aspiration PNA. Made NPO. Lastly, family reports increase in abdominal tenderness and distention. Spoke with surgery who gives RN order for NGT. Patient and daughter agreeable. NGT placed patient tolerated well. X ray pending for placement. Will continue to assess distention, HR and vomiting issues. Both the surgeon and hospitalist have spoke with the daughter to update her on the plan of care

## 2018-01-08 NOTE — Progress Notes (Signed)
Advanced care plan.  Purpose of the Encounter: CODE STATUS  Parties in Attendance: Patient himself and daughter his problem  Patient's Decision Capacity: Not intact  Subjective/Patient's story:  Patient is a 84 year old with history of dementia being admitted with abdominal pain noted to have severe ischemic colitis involving small and large bowel  Objective/Medical story  I discussed with the patient's daughter regarding overall poor prognosis with this condition and recommended DO NOT RESUSCITATE and palliative care consult if does not improve  Goals of care determination: Daughter in agreement to make patient DNR   CODE STATUS: DNR   Time spent discussing advanced care planning: 16 minutes

## 2018-01-08 NOTE — ED Triage Notes (Signed)
pt comes into the ED via EMS from [facility]. with c/o ams. hypotensive. hr 90-120's no hx of a-fib, cbg 128.. 18g left ac. Pt does have a hx of dementia. Pt is alert on arrival,oriented to self and place.

## 2018-01-08 NOTE — Progress Notes (Addendum)
NGT placed. Output is 2300 ml of brown fluid

## 2018-01-08 NOTE — ED Notes (Signed)
OR at bedside speaking with family and patient.

## 2018-01-08 NOTE — ED Notes (Signed)
Admitting MD at bedside.

## 2018-01-08 NOTE — Plan of Care (Signed)
  Problem: Education: Goal: Knowledge of General Education information will improve Description Including pain rating scale, medication(s)/side effects and non-pharmacologic comfort measures Outcome: Progressing   Problem: Health Behavior/Discharge Planning: Goal: Ability to manage health-related needs will improve Outcome: Progressing   Problem: Clinical Measurements: Goal: Ability to maintain clinical measurements within normal limits will improve Outcome: Progressing Goal: Will remain free from infection Outcome: Progressing Goal: Diagnostic test results will improve Outcome: Progressing Goal: Respiratory complications will improve Outcome: Progressing Goal: Cardiovascular complication will be avoided Outcome: Progressing   Problem: Activity: Goal: Risk for activity intolerance will decrease Outcome: Progressing   Problem: Nutrition: Goal: Adequate nutrition will be maintained Outcome: Progressing  Patient vomited several times. NGT now in place. On IVF

## 2018-01-08 NOTE — ED Notes (Signed)
Per pt daughter, pt was [facility] for unknown down time, pt has had diarrhea for the past 3 days that they know of, states the hall he lives on has been quarantined due to stomach virus and they have not been allowed to visit. Pt has a noted distended abd on arrival. EMS gave 500ml IV fluids in route.

## 2018-01-08 NOTE — ED Notes (Signed)
Pt noted belching and brown liquid coming up, pt sit in an upright position and given an emesis bag. Pt denies any nausea, states when he belches  liquid comes up with it.

## 2018-01-08 NOTE — Consult Note (Addendum)
[facility] SURGICAL CONSULTATION NOTE [phone]    HISTORY OF PRESENT ILLNESS (HPI):  84 y.o. male presented to [facility] ED today for evaluation of abdominal pain.  History is limited given history of dementia. Patient's daughter at bedside reports that he has had about 1 year of abdominal pain which has acutely worsened recently. No reports of nausea or emesis currently. Patient is currently on hospice/palliative care. She reports that he has become quite debilitated and requires assistance for most of his IADLS and ADLS. Work up in the ED is concerning for lactic acidosis and ischemic bowel on imaging.   Surgery is consulted by emergency medicine physician Dr. Minna AntisKevin Paduchowski in this context for evaluation and management of possible ischemic bowel.  PAST MEDICAL HISTORY (PMH):  Past Medical History:  Diagnosis Date  . Anxiety   . Dementia (HCC)   . Hypertension   . Tuberculosis      PAST SURGICAL HISTORY [facility](PSH):  Past Surgical History:  Procedure Laterality Date  . APPENDECTOMY    . JOINT REPLACEMENT     bilateral knee     MEDICATIONS:  Prior to Admission medications   Medication Sig Start Date End Date Taking? Authorizing Provider  ALPRAZolam (XANAX) 0.25 MG tablet Take 0.25 mg by mouth 2 (two) times daily.    Yes [provider]  ALPRAZolam (XANAX) 0.25 MG tablet Take 0.25 mg by mouth at bedtime as needed for anxiety or sleep.    Yes [provider]  amLODipine (NORVASC) 5 MG tablet Take 5 mg by mouth daily.   Yes [provider]  aspirin EC 81 MG tablet Take 81 mg by mouth every other day.   Yes [provider]  busPIRone (BUSPAR) 5 MG tablet Take 5 mg by mouth 2 (two) times daily.   Yes [provider]  cholecalciferol (VITAMIN D) 1000 units tablet Take 2,000 Units by mouth daily.   Yes [provider]  hydrochlorothiazide (HYDRODIURIL) 25 MG tablet Take 25 mg by mouth daily.   Yes [provider]   mineral oil liquid Instill 2 drops in both ears every Monday morning   Yes [provider]  polyethylene glycol (MIRALAX / GLYCOLAX) packet Take 17 g by mouth daily as needed for mild constipation or moderate constipation.   Yes [provider]  prednisoLONE acetate (PRED FORTE) 1 % ophthalmic suspension Place 1 drop into both eyes 3 (three) times daily.   Yes [provider]  sodium chloride (MURO 128) 5 % ophthalmic solution Place 1 drop into both eyes 3 (three) times daily.   Yes [provider]  tamsulosin (FLOMAX) 0.4 MG CAPS capsule Take 0.4 mg by mouth daily.   Yes [provider]  triamcinolone cream (KENALOG) 0.1 % Apply 1 application topically 2 (two) times daily as needed (for itching).    Yes [provider]  azithromycin (ZITHROMAX Z-PAK) 250 MG tablet Take 2 tablets (500 mg) on  Day 1,  followed by 1 tablet (250 mg) once daily on Days 2 through 5. Patient not taking: Reported on 11/04/2017 09/05/17   Emily FilbertWilliams, Jonathan E, MD     ALLERGIES:  No Known Allergies   SOCIAL HISTORY:  Social History   Socioeconomic History  . Marital status: Widowed    Spouse name: Not on file  . Number of children: Not on file  . Years of education: Not on file  . Highest education level: Not on file  Occupational History  . Not on file  Social Needs  . Financial resource strain: Not on file  . Food insecurity:    Worry: Not on file    Inability: Not on file  . Transportation needs:    Medical: Not on file    Non-medical: Not on file  Tobacco Use  . Smoking status: Never Smoker  . Smokeless tobacco: Never Used  Substance and Sexual Activity  . Alcohol use: No  . Drug use: No  . Sexual activity: Not on file  Lifestyle  . Physical activity:    Days per week: Not on file    Minutes per session: Not on file  . Stress: Not on file  Relationships  . Social connections:    Talks on phone: Not on file    Gets together: Not on file     Attends religious service: Not on file    Active member of club or organization: Not on file    Attends meetings of clubs or organizations: Not on file    Relationship status: Not on file  . Intimate partner violence:    Fear of current or ex partner: Not on file    Emotionally abused: Not on file    Physically abused: Not on file    Forced sexual activity: Not on file  Other Topics Concern  . Not on file  Social History Narrative  . Not on file     FAMILY HISTORY:  No family history on file.    REVIEW OF SYSTEMS:  Review of Systems  Unable to perform ROS: Dementia  Gastrointestinal: Positive for abdominal pain. Negative for nausea and vomiting.    VITAL SIGNS:  Temp:  [97.9 F (36.6 C)] 97.9 F (36.6 C) (12/20 0844) Pulse Rate:  [86-99] 88 (12/20 1015) Resp:  [18-26] 22 (12/20 1015) BP: (92-144)/(51-75) 144/69 (12/20 1015) SpO2:  [94 %-100 %] 95 % (12/20 1015) Weight:  [83.7 kg] 83.7 kg (12/20 0845)     Height: 6' (182.9 cm) Weight: 83.7 kg BMI (Calculated): 25.03   INTAKE/OUTPUT:  This shift: Total I/O In: 1100 [IV Piggyback:1100] Out: -   Last 2 shifts: @IOLAST2SHIFTS @   PHYSICAL EXAM:  Physical Exam Vitals signs and nursing note reviewed.  Constitutional:      Appearance: Normal appearance. He is not ill-appearing.  HENT:     Head: Normocephalic and atraumatic.  Eyes:     General: No scleral icterus.    Extraocular Movements: Extraocular movements intact.     Conjunctiva/sclera: Conjunctivae normal.  Cardiovascular:     Rate and Rhythm: Normal rate and regular rhythm.  Extrasystoles (PVCs) are present.    Pulses: Normal pulses.          Dorsalis pedis pulses are 2+ on the right side and 2+ on the left side.       Posterior tibial pulses are 2+ on the right side and 2+ on the left side.     Heart sounds: Normal heart sounds. No murmur. No friction rub. No gallop.   Pulmonary:     Effort: Pulmonary effort is normal. No respiratory distress.     Breath  sounds: Normal breath sounds. No wheezing or rhonchi.  Abdominal:     General: Abdomen is protuberant. There is distension.     Tenderness: There is abdominal tenderness in the right upper quadrant. There is no guarding or rebound.     Comments: Patient's abdomen is tense and markedly distended, no rebound tenderness but he is most tender in RUQ  Skin:  General: Skin is warm and dry.     Coloration: Skin is not jaundiced or pale.  Neurological:     Mental Status: He is alert.  Psychiatric:        Mood and Affect: Mood normal.       Labs:  CBC Latest Ref Rng & Units 01/08/2018 11/04/2017 09/05/2017  WBC 4.0 - 10.5 K/uL 17.4(H) 7.5 8.8  Hemoglobin 13.0 - 17.0 g/dL 12.1(L) 11.0(L) 12.3(L)  Hematocrit 39.0 - 52.0 % 37.7(L) 32.4(L) 34.5(L)  Platelets 150 - 400 K/uL 210 184 203   CMP Latest Ref Rng & Units 01/08/2018 11/04/2017 09/05/2017  Glucose 70 - 99 mg/dL 97 119(J105(H) 478(G126(H)  BUN 8 - 23 mg/dL 95(A74(H) 21(H32(H) 08(M27(H)  Creatinine 0.61 - 1.24 mg/dL 5.78(I4.03(H) 6.96(E1.41(H) 9.521.14  Sodium 135 - 145 mmol/L 132(L) 135 131(L)  Potassium 3.5 - 5.1 mmol/L 4.2 3.5 3.9  Chloride 98 - 111 mmol/L 100 99 95(L)  CO2 22 - 32 mmol/L 12(L) 26 26  Calcium 8.9 - 10.3 mg/dL 7.9(L) 8.8(L) 9.5  Total Protein 6.5 - 8.1 g/dL 7.1 7.0 -  Total Bilirubin 0.3 - 1.2 mg/dL 1.0 0.8 -  Alkaline Phos 38 - 126 U/L 63 57 -  AST 15 - 41 U/L 116(H) 19 -  ALT 0 - 44 U/L 57(H) 14 -    Imaging studies:   -- CT Abdomen/Pelvis on 01/08/18:  IMPRESSION: 1) There are changes of small-bowel dilatation with distal ileal pneumatosis as well as changes of wall thickening and pericolonic air in the ascending and transverse colons. These changes are highly suspicious for ischemia of the large and small bowel given its distribution. Proximal jejunal dilatation is noted in a compensatory manner. 2) Associated portal venous air is seen within the left portal vein as well as the portal branches within the left lobe.    Assessment/Plan: 84  y.o. male with an examination and imaging findings concerning for pneumatosis, portal venous gas, and ischemia of his bowel as well as a lactic acidosis, complicated by pertinent comorbidities including advanced age, dementia, and HTN.   - After long discussion with family at bedside regarding the goals of care and patient's wishes, they wish to proceed with conservative management and decline surgical intervention at this time   - Admit to medicine, consider critical care consultation   - Recommend aggressive IVF hydration, Abx, and pain control  - Continue to monitor abdominal examination    - Recommend palliative care consultation in hospital to continue goals of care with family   - Medical management otherwise   - General surgery will follow peripherally  All of the above findings and recommendations were discussed with the patient and his family, and all of his family's questions were answered to their expressed satisfaction.  Thank you for the opportunity to participate in this patient's care.   -- Lynden OxfordZachary Schulz, PA-C [facility] 01/08/2018, 11:34 AM [phone] M-F: 7am - 4pm  I saw and evaluated the patient.  I agree with the above documentation, exam, and plan, which I have edited where appropriate. Duanne GuessJennifer Kayse Puccini  12:38 PM

## 2018-01-08 NOTE — Progress Notes (Signed)
Family requesting that patient telemetry be removed as he is agitated and pulls the leads constantly. Family is transitioning the patient towards comfort care and his heart rate has stabilized. I expressed to the family that without the monitor we will not know if the patient is in a fib still or how fast his heart rate it. Family aware of purpose of cardiac monitoring but at this time they prefer to remove it as it is an irritant for the patient. Paged on call MD who gives me orders to remove.

## 2018-01-09 LAB — BASIC METABOLIC PANEL
Anion gap: 18 — ABNORMAL HIGH (ref 5–15)
BUN: 96 mg/dL — ABNORMAL HIGH (ref 8–23)
CO2: 16 mmol/L — ABNORMAL LOW (ref 22–32)
CREATININE: 5.16 mg/dL — AB (ref 0.61–1.24)
Calcium: 7.2 mg/dL — ABNORMAL LOW (ref 8.9–10.3)
Chloride: 100 mmol/L (ref 98–111)
GFR calc Af Amer: 11 mL/min — ABNORMAL LOW (ref >60)
GFR calc non Af Amer: 9 mL/min — ABNORMAL LOW (ref >60)
Glucose, Bld: 81 mg/dL (ref 70–99)
Potassium: 4.3 mmol/L (ref 3.5–5.1)
Sodium: 134 mmol/L — ABNORMAL LOW (ref 135–145)

## 2018-01-09 LAB — CBC
HCT: 34.9 % — ABNORMAL LOW (ref 39.0–52.0)
Hemoglobin: 11.4 g/dL — ABNORMAL LOW (ref 13.0–17.0)
MCH: 30.7 pg (ref 26.0–34.0)
MCHC: 32.7 g/dL (ref 30.0–36.0)
MCV: 94.1 fL (ref 80.0–100.0)
Platelets: 191 10*3/uL (ref 150–400)
RBC: 3.71 MIL/uL — ABNORMAL LOW (ref 4.22–5.81)
RDW: 13.2 % (ref 11.5–15.5)
WBC: 15.1 10*3/uL — ABNORMAL HIGH (ref 4.0–10.5)
nRBC: 0.1 % (ref 0.0–0.2)

## 2018-01-09 MED ORDER — SODIUM CHLORIDE 0.9 % IV BOLUS
1000.0000 mL | Freq: Once | INTRAVENOUS | Status: AC
  Administered 2018-01-09: 1000 mL via INTRAVENOUS

## 2018-01-09 MED ORDER — HEPARIN SODIUM (PORCINE) 5000 UNIT/ML IJ SOLN: 5000.0000 [IU] | Freq: Two times a day (BID) | INTRAMUSCULAR | Status: DC

## 2018-01-09 NOTE — Progress Notes (Signed)
Pt.'s family does not want nursing staff to rotate pt at this time due to pt resting comfortably. RN will continue to monitor pt closely.   Maddie Brazier [place]

## 2018-01-09 NOTE — Clinical Social Work Note (Signed)
CSW received a consult for hospice placement. The CSW contacted Debbie, the RN Admissions coordinator for [facility] [address] [place]. The CSW has sent the referral. The facility currently has no availability and will assess the patient for waitlist placement. CSW is following.  Joel PonderKaren Martha Kinley Charles, MSW, Theresia MajorsLCSWA [phone]

## 2018-01-09 NOTE — Progress Notes (Signed)
Dr Luberta MutterKonidena notifed not tolerating bolus, rate decreased to 50

## 2018-01-09 NOTE — Progress Notes (Signed)
Pt.'s family refuses all PO daily medications at this time and wants their father to continue to rest. RN will continue to monitor pt closely.   Joel Charles [place]

## 2018-01-09 NOTE — Progress Notes (Signed)
Verbal order from MD to place RN may pronounce death. MD also aware that pt.'s BP is 82/42 manually. RN will continue to monitor pt closely. Pt.'s family updated.   Joel Charles [place]

## 2018-01-09 NOTE — Progress Notes (Signed)
[facility] [place] at [facility]   PATIENT NAME: Joel Charles    MR#:  [id number]  DATE OF BIRTH:  23-Apr-1926  SUBJECTIVE: Admitted for ischemic colitis, sepsis due to ischemic colitis, patient was hypotensive received fluid boluses, had NG tube placed last night, patient reported, spoke with patient's daughter, son at bedside, they are requesting comfort care for him.  CHIEF COMPLAINT:   Chief Complaint  Patient presents with  . Altered Mental Status  Patient found on the floor yesterday at [facility] and the daughter was very upset about that.  Patient was followed by [facility] hospice, according to daughter she discontinued the services because of yesterday's episode.  REVIEW OF SYSTEMS:   Review of Systems  Unable to perform ROS: Dementia   Was able to tell that his abdomen is hurting all over.  But other than that could not elicit any other complaints. DRUG ALLERGIES:  No Known Allergies  VITALS:  Blood pressure (!) 90/58, pulse 93, temperature 98.5 F (36.9 C), temperature source Oral, resp. rate 20, height 6' (1.829 m), weight 83.7 kg, SpO2 93 %.  PHYSICAL EXAMINATION:  GENERAL:  84 y.o.-year-old patient lying in the bed with some distress due to abdominal pain and appears, uncomfortable. EYES: Pupils equal, round, reactive to light and accommodation. No scleral icterus. Extraocular muscles intact.  HEENT: Head atraumatic, normocephalic. Oropharynx and nasopharynx clear.  NECK:  Supple, no jugular venous distention. No thyroid enlargement, no tenderness.  LUNGS: Diminished breath sound bilaterally. CARDIOVASCULAR: S1, S2 slightly tachycardic.  No murmurs, rubs, or gallops.  ABDOMEN: Distended, tender all over, diminished bowel sounds.Marland Kitchen  EXTREMITIES: No pedal edema, cyanosis, or clubbing.  NEUROLOGIC: Unable to follow full neuro exam due to dementia  PSYCHIATRIC: Patient is lethargic SKIN: No obvious rash, lesion, or ulcer.    LABORATORY  PANEL:   CBC Recent Labs  Lab 01/09/18 0537  WBC 15.1*  HGB 11.4*  HCT 34.9*  PLT 191   ------------------------------------------------------------------------------------------------------------------  Chemistries  Recent Labs  Lab 01/08/18 0852 01/09/18 0537  NA 132* 134*  K 4.2 4.3  CL 100 100  CO2 12* 16*  GLUCOSE 97 81  BUN 74* 96*  CREATININE 4.03* 5.16*  CALCIUM 7.9* 7.2*  AST 116*  --   ALT 57*  --   ALKPHOS 63  --   BILITOT 1.0  --    ------------------------------------------------------------------------------------------------------------------  Cardiac Enzymes Recent Labs  Lab 01/08/18 0852  TROPONINI 0.10*   ------------------------------------------------------------------------------------------------------------------  RADIOLOGY:  Ct Abdomen Pelvis Wo Contrast  Result Date: 01/08/2018 CLINICAL DATA:  Abdominal pain and diarrhea EXAM: CT ABDOMEN AND PELVIS WITHOUT CONTRAST TECHNIQUE: Multidetector CT imaging of the abdomen and pelvis was performed following the standard protocol without IV contrast. COMPARISON:  None. FINDINGS: Lower chest: Scattered calcifications are noted in the lung bases. Some chronic fibrotic changes are seen. No focal infiltrate is noted. Hepatobiliary: The liver is well visualized. Peripheral air densities are noted primarily in the left lobe consistent with portal venous air. Considerable air is noted within the left portal vein. The gallbladder is within normal limits. Pancreas: Unremarkable. No pancreatic ductal dilatation or surrounding inflammatory changes. Spleen: Normal in size without focal abnormality. Adrenals/Urinary Tract: Adrenal glands are within normal limits bilaterally. Kidneys are well visualized bilaterally without renal calculi or obstructive changes. The bladder is partially distended. Stomach/Bowel: Diverticular change of the sigmoid colon is noted. Some diffuse wall thickening is seen within the transverse  colon and ascending colon with evidence of perforation and  pericolonic air along the course of the ascending colon. The appendix has been surgically removed. Terminal ileum appears within normal limits. The proximal and mid ileum however show changes of pneumatosis and mild dilatation. No definitive transition zone is seen. Stomach is well distended with oral contrast material. Vascular/Lymphatic: Diffuse aortic calcifications are noted. No significant lymphadenopathy is seen. Reproductive: Prostate is unremarkable. Other: Mild free fluid is noted within the pelvis and mid abdomen. Musculoskeletal: Degenerative changes of the lumbar spine are noted. IMPRESSION: There are changes of small-bowel dilatation with distal ileal pneumatosis as well as changes of wall thickening and pericolonic air in the ascending and transverse colons. These changes are highly suspicious for ischemia of the large and small bowel given its distribution. Proximal jejunal dilatation is noted in a compensatory manner. Associated portal venous air is seen within the left portal vein as well as the portal branches within the left lobe. Critical Value/emergent results were called by telephone at the time of interpretation on 01/08/2018 at 10:58 am to Dr. Minna AntisKEVIN PADUCHOWSKI , who verbally acknowledged these results. Electronically Signed   By: Alcide CleverMark  Lukens M.D.   On: 01/08/2018 11:00   Dg Chest 1 View  Result Date: 01/08/2018 CLINICAL DATA:  Possible aspiration pneumonitis, nasogastric tube EXAM: CHEST  1 VIEW COMPARISON:  Portable exam 1628 hours compared to 01/08/2018 FINDINGS: Nasogastric tube extends into stomach. Upper normal size of cardiac silhouette. Mediastinal contours and pulmonary vascularity normal. Extensive RIGHT upper lobe scarring. Interstitial prominence in the mid to lower lungs again identified with subsegmental atelectasis at both lung bases. No definite acute infiltrate, pleural effusion or pneumothorax. Mild central  peribronchial thickening noted. Bones demineralized with multiple LEFT rib fractures and advanced degenerative changes of the LEFT shoulder joint. IMPRESSION: Chronic bronchitic and interstitial disease changes with bibasilar atelectasis and RIGHT upper lobe scarring. No definite acute infiltrate. Electronically Signed   By: Ulyses SouthwardMark  Boles M.D.   On: 01/08/2018 16:44   Dg Chest Port 1 View  Result Date: 01/08/2018 CLINICAL DATA:  Sepsis. History of tuberculosis. EXAM: PORTABLE CHEST 1 VIEW COMPARISON:  09/10/2017. FINDINGS: Stable borderline enlarged cardiac silhouette. Decreased inspiration with stable chronic prominence of the interstitial markings and right apical pleural and parenchymal scarring. No superimposed airspace opacity. Diffuse osteopenia. Previously demonstrated left shoulder degenerative changes with bony remodeling. IMPRESSION: No acute finding. Stable chronic interstitial lung disease. Electronically Signed   By: Beckie SaltsSteven  Reid M.D.   On: 01/08/2018 09:36    EKG:   Orders placed or performed during the hospital encounter of 01/08/18  . ED EKG 12-Lead  . ED EKG 12-Lead    ASSESSMENT AND PLAN:   #1. severe sepsis secondary toischemic colitis: Received IV fluids, IV antibiotics, seen by surgery, patient has pneumatosis, portal venous gas, ischemia of his bowels with lactic acidosis. #2 severe ischemic colitis, seen by surgery, patient is not a surgical candidate because of severe underlying dementia, advanced age, hypertension, medical management advised by surgery, received IV fluids, IV antibiotic, pain control, patient received NG tube to suction last night but it came out and patient's family especially the daughter and son wants him to be on comfort care measures.  Discussed about IV antibiotics, IV fluids, medicine stay remain focuses on comfort care at this time and also wants him to have at least [place] or something for comfort.  So comfort care orders are placed, no further  blood work or IV antibiotics, use Ativan, morphine, comfort feeds, continue oxygen, they would like him to  go to hospice home if possible.   #3. hypotensive, received fluid, not stable to go to hospice home yet.  All the records are reviewed and case discussed with Care Management/Social Workerr. Management plans discussed with the patient, family and they are in agreement.  CODE STATUS: DNR  TOTAL TIME TAKING CARE OF THIS PATIENT: 40minutes.   POSSIBLE D/C IN 1-2 DAYS, DEPENDING ON CLINICAL CONDITION. More than 50% of the time spent in counseling, coordination of care  Katha HammingSnehalatha Alvita Fana M.D on 01/09/2018 at 9:03 AM  Between 7am to 6pm - Pager - [phone]  After 6pm go to [email or web address] - [email or web address]  [facility]  Office  [phone]  CC: Primary care physician; Housecalls, [facility] Making   Note: This dictation was prepared with Dragon dictation along with smaller phrase technology. Any transcriptional errors that result from this process are unintentional.

## 2018-01-09 NOTE — Progress Notes (Signed)
Verbal order from MD due to pt now being on comfort measures to discontinue fluids, discontinue enteric precautions for cdiff rule out due to pt not having a BM in 24 hours, place pt on clear liquids, change VS to Q12.  Julicia Krieger [employer]

## 2018-01-09 NOTE — Progress Notes (Signed)
Pt removed NG tube, family wanted to wait to have it replaced until talking to md. Prime doc paged

## 2018-01-09 NOTE — Progress Notes (Signed)
Spoke to daughter of patient to confirm code status of her father. Previous status of DNR was cancelled 01/08/2018 by Dr. Allena KatzPatel however notes from MD specify comfort care was not yet decided but DNR was still the wishes of daughter and patient. Code status listed as "full" at shift change.   Notified MD Auburn BilberryShreyang Patel that daughter continues to request DNR. Educated daughter on the difference between code status and comfort measures.  Additionally, Daughter states she and her family still plan to go to full comfort measures come morning but at this time would like to make sure DNR is in place.  New order for DNR updated by Dr. Allena KatzPatel and DNR bracelet is in place.

## 2018-01-10 MED ORDER — MORPHINE SULFATE (PF) 4 MG/ML IV SOLN
4.0000 mg | INTRAVENOUS | Status: DC | PRN
  Administered 2018-01-10 – 2018-01-11 (×5): 4 mg via INTRAVENOUS
  Filled 2018-01-10 (×5): qty 1

## 2018-01-10 NOTE — Progress Notes (Signed)
[facility] [place] [facility]   PATIENT NAME: Joel ReeseWillie Charles    MR#:  [id number]  DATE OF BIRTH:  02/22/1926  SUBJECTIVE: Patient is on comfort care.  Family is requesting higher dose of morphine as pt is still uncomfortable due to abdominal pain.  CHIEF COMPLAINT:   Chief Complaint  Patient presents with  . Altered Mental Status   REVIEW OF SYSTEMS:   Review of Systems  Unable to perform ROS: Dementia   Was able to tell that his abdomen is hurting all over.  But other than that could not elicit any other complaints. DRUG ALLERGIES:  No Known Allergies  VITALS:  Blood pressure (!) 89/40, pulse 78, temperature 97.7 F (36.5 C), temperature source Axillary, resp. rate 17, height 6' (1.829 m), weight 83.7 kg, SpO2 98 %.  PHYSICAL EXAMINATION:  GENERAL:  84 y.o.-year-old patient lying in the bed with some distress due to abdominal pain and appears, uncomfortable. EYES: Pupils equal, round, reactive to light and accommodation. No scleral icterus. Extraocular muscles intact.  HEENT: Head atraumatic, normocephalic. Oropharynx and nasopharynx clear.  NECK:  Supple, no jugular venous distention. No thyroid enlargement, no tenderness.  LUNGS: Diminished breath sound bilaterally. CARDIOVASCULAR: S1, S2 slightly tachycardic.  No murmurs, rubs, or gallops.  ABDOMEN: Distended, tender all over, diminished bowel sounds.Marland Kitchen.  EXTREMITIES: No pedal edema, cyanosis, or clubbing.  NEUROLOGIC: Unable to follow full neuro exam due to dementia  PSYCHIATRIC: Patient is lethargic SKIN: No obvious rash, lesion, or ulcer.    LABORATORY PANEL:   CBC Recent Labs  Lab 01/09/18 0537  WBC 15.1*  HGB 11.4*  HCT 34.9*  PLT 191   ------------------------------------------------------------------------------------------------------------------  Chemistries  Recent Labs  Lab 01/08/18 0852 01/09/18 0537  NA 132* 134*  K 4.2 4.3  CL 100 100  CO2 12* 16*  GLUCOSE 97  81  BUN 74* 96*  CREATININE 4.03* 5.16*  CALCIUM 7.9* 7.2*  AST 116*  --   ALT 57*  --   ALKPHOS 63  --   BILITOT 1.0  --    ------------------------------------------------------------------------------------------------------------------  Cardiac Enzymes Recent Labs  Lab 01/08/18 0852  TROPONINI 0.10*   ------------------------------------------------------------------------------------------------------------------  RADIOLOGY:  Dg Chest 1 View  Result Date: 01/08/2018 CLINICAL DATA:  Possible aspiration pneumonitis, nasogastric tube EXAM: CHEST  1 VIEW COMPARISON:  Portable exam 1628 hours compared to 01/08/2018 FINDINGS: Nasogastric tube extends into stomach. Upper normal size of cardiac silhouette. Mediastinal contours and pulmonary vascularity normal. Extensive RIGHT upper lobe scarring. Interstitial prominence in the mid to lower lungs again identified with subsegmental atelectasis at both lung bases. No definite acute infiltrate, pleural effusion or pneumothorax. Mild central peribronchial thickening noted. Bones demineralized with multiple LEFT rib fractures and advanced degenerative changes of the LEFT shoulder joint. IMPRESSION: Chronic bronchitic and interstitial disease changes with bibasilar atelectasis and RIGHT upper lobe scarring. No definite acute infiltrate. Electronically Signed   By: Ulyses SouthwardMark  Boles M.D.   On: 01/08/2018 16:44    EKG:   Orders placed or performed during the hospital encounter of 01/08/18  . ED EKG 12-Lead  . ED EKG 12-Lead    ASSESSMENT AND PLAN:   #1. severe sepsis secondary toischemic colitis: Received IV fluids, IV antibiotics, seen by surgery, patient has pneumatosis, portal venous gas, ischemia of his bowels with lactic acidosis. #2 .severe ischemic colitis, seen by surgery, patient is not a surgical candidate because of severe underlying dementia, advanced age, hypertension, medical management advised by surgery, received IV fluids,  IV  antibiotic, pain control, patient received NG tube to suction last night but it came out and patient's family especially the daughter and son wants him to be on comfort care measures.  Continue comfort measures with morphine, Ativan, adjusted the morphine dose.    #3. hypotensive, likely due t.,  Underlying sepsis with ischemic colitis.  On comfort care.   Social worker  is consulted, no beds at hospice home as per her note yesterday.  Discussed with patient's family  All the records are reviewed and case discussed with Care Management/Social Workerr. Management plans discussed with the patient, family and they are in agreement.  CODE STATUS: DNR  TOTAL TIME TAKING CARE OF THIS PATIENT: 40minutes.   POSSIBLE D/C IN 1-2 DAYS, DEPENDING ON CLINICAL CONDITION. More than 50% of the time spent in counseling, coordination of care  Katha HammingSnehalatha Verlaine Embry M.D on 01/10/2018 at 1:15 PM  Between 7am to 6pm - Pager - [phone]  After 6pm go to [email or web address] - password [email or web address]  [facility] [place] [facility]  Office  [phone]  CC: Primary care physician; Housecalls, Doctors Making   Note: This dictation was prepared with Dragon dictation along with smaller phrase technology. Any transcriptional errors that result from this process are unintentional.

## 2018-01-11 MED ORDER — MORPHINE SULFATE (CONCENTRATE) 10 MG/0.5ML PO SOLN: 10.0000 mg | ORAL | Status: DC | PRN

## 2018-01-11 MED ORDER — MORPHINE SULFATE (CONCENTRATE) 10 MG/0.5ML PO SOLN: 10.0000 mg | ORAL | 0 refills | Status: AC | PRN

## 2018-01-11 NOTE — Progress Notes (Signed)
New referral for inpatient hospice on 84 year old gentleman who was [facility] with severe ischemic colitis involving both small and large  Bowel.  [facility]  Patient has not responded to IV antibiotics and supportive care, so family has elected full comfort measures. Past history of anxiety, dementia, HTN tuberculosis.  Patient is currently lethargic and minimally responsive.  To discharge to the hospice home today at 1230.  Patient's son, Kathlene NovemberMike will meet with Peyton BottomsSue Smith today at 1215 to complete admission paperwork.  Allergies:  NKDA  Ht:  6' Wt:  184# BMI:  25 PPS:  10%  Incontinent of bowel and bladder.  No foley cath. Last BM:  12.22.19 No oral intake Unable to take PO meds IV's:  Pheripheral 18g left AC inserted 12.20.19          Pheripheral IV right arm 18 g inserted 12.20.19  Symptoms to manage:      Pain:  Morphine 4mg  IV every 4 hours PRN- last dose 0749 today    Anxiety: xanax 0.25mg  BID,  Buspar 5mg  PO BID  (unable to take oral meds at this time-)  Labs:    Recent Labs  Lab 01/09/18 0537  WBC 15.1*  HGB 11.4*  HCT 34.9*  PLT 191     Lab 01/08/18 0852 01/09/18 0537  NA 132* 134*  K 4.2 4.3  CL 100 100  CO2 12* 16*  GLUCOSE 97 81  BUN 74* 96*  CREATININE 4.03* 5.16*  CALCIUM 7.9* 7.2*  AST 116*  --   ALT 57*  --   ALKPHOS 63  --   BILITOT 1.0  --    Lab 01/08/18 0852  TROPONINI 0.10*   MD note: Katha HammingSnehalatha Konidena M.D #1. severe sepsis secondary toischemic colitis: Received IV fluids, IV antibiotics, seen by surgery, patient has pneumatosis, portal venous gas, ischemia of his bowels with lactic acidosis. #2 .severe ischemic colitis, seen by surgery, patient is not a surgical candidate because of severe underlying dementia, advanced age, hypertension, medical management advised by surgery, received IV fluids, IV antibiotic, pain control, patient received NG tube to suction last night but it came  out and patient's family especially the daughter and son wants him to be on comfort care measures.  Continue comfort measures with morphine, Ativan, adjusted the morphine dose.    #3. hypotensive, likely due t.,  Underlying sepsis with ischemic colitis.  On comfort care.   Social worker  is consulted, no beds at hospice home as per her note yesterday.  Discussed with patient's family

## 2018-01-11 NOTE — Progress Notes (Signed)
Joel Charles  A and O x 1. VSS. No complaints of pain or nausea. pt send to hospice home with two iv's. prescriptions given to EMS. Pt discharged via EMS to hospice home.    Allergies as of 01/11/2018   No Known Allergies     Medication List    STOP taking these medications   ALPRAZolam 0.25 MG tablet Commonly known as:  XANAX   amLODipine 5 MG tablet Commonly known as:  NORVASC   aspirin EC 81 MG tablet   azithromycin 250 MG tablet Commonly known as:  ZITHROMAX Z-PAK   busPIRone 5 MG tablet Commonly known as:  BUSPAR   cholecalciferol 25 MCG (1000 UT) tablet Commonly known as:  VITAMIN D   hydrochlorothiazide 25 MG tablet Commonly known as:  HYDRODIURIL   mineral oil liquid   polyethylene glycol packet Commonly known as:  MIRALAX / GLYCOLAX   prednisoLONE acetate 1 % ophthalmic suspension Commonly known as:  PRED FORTE   sodium chloride 5 % ophthalmic solution Commonly known as:  MURO 128   tamsulosin 0.4 MG Caps capsule Commonly known as:  FLOMAX   triamcinolone cream 0.1 % Commonly known as:  KENALOG     TAKE these medications   morphine CONCENTRATE 10 MG/0.5ML Soln concentrated solution Take 0.5 mLs (10 mg total) by mouth every 2 (two) hours as needed for severe pain.       Vitals:   01/10/18 2041 01/11/18 0533  BP: 112/61 103/61  Pulse: 71 76  Resp:  20  Temp:    SpO2: 98% 94%    Joel Charles

## 2018-01-11 NOTE — Discharge Summary (Signed)
Joel Charles, is a 84 y.o. male  DOB 11/01/1926  MRN [id number].  Admission date:  01/08/2018  Admitting Physician  Auburn Bilberry, MD  Discharge Date:  01/11/2018   Primary MD  Housecalls, Doctors Making  Recommendations for primary care physician for things to follow:   going to hospice home today.   Admission Diagnosis  Ischemic bowel disease (HCC) [K55.9] AKI (acute kidney injury) (HCC) [N17.9] Sepsis, due to unspecified organism, unspecified whether acute organ dysfunction present [facility]) [A41.9]   Discharge Diagnosis  Ischemic bowel disease (HCC) [K55.9] AKI (acute kidney injury) (HCC) [N17.9] Sepsis, due to unspecified organism, unspecified whether acute organ dysfunction present [facility]) [A41.9]    Active Problems:   Ischemic colitis [facility])      Past Medical History:  Diagnosis Date  . Anxiety   . Dementia (HCC)   . Hypertension   . Tuberculosis     Past Surgical History:  Procedure Laterality Date  . APPENDECTOMY    . JOINT REPLACEMENT     bilateral knee       History of present illness and  Hospital Course:     Kindly see H&P for history of present illness and admission details, please review complete Labs, Consult reports and Test reports for all details in brief  HPI  from the history and physical done on the day of admission  84 year old male patient with history of dementia, hypertension, anxiety came in because of altered mental status, patient was found on the floor at [facility].  Found to have hypotension, blood work showed ischemic colitis of small, large ball and admitted for the same.  Hospital Course  #1 sepsis present on admission secondary to ischemic colitis, patient in the surgery, they felt that patient is high risk for surgery because of advanced dementia, advanced age, family  chose comfort measures with morphine, Ativan.  Patient initially received aggressive fluids, antibiotics, NG tube suctioning but later on patient the family especially the daughter and son wanted comfort measures, patient will go to hospice home today.  Continue Roxanol, Ativan.     Discharge Condition: Stable   Follow UP      Discharge Instructions  and  Discharge Medications      Allergies as of 01/11/2018   No Known Allergies     Medication List    STOP taking these medications   ALPRAZolam 0.25 MG tablet Commonly known as:  XANAX   amLODipine 5 MG tablet Commonly known as:  NORVASC   aspirin EC 81 MG tablet   azithromycin 250 MG tablet Commonly known as:  ZITHROMAX Z-PAK   busPIRone 5 MG tablet Commonly known as:  BUSPAR   cholecalciferol 25 MCG (1000 UT) tablet Commonly known as:  VITAMIN D   hydrochlorothiazide 25 MG tablet Commonly known as:  HYDRODIURIL   mineral oil liquid   polyethylene glycol packet Commonly known as:  MIRALAX / GLYCOLAX   prednisoLONE acetate 1 % ophthalmic suspension Commonly known as:  PRED FORTE   sodium chloride 5 % ophthalmic solution Commonly known as:  MURO 128   tamsulosin 0.4 MG Caps capsule Commonly known as:  FLOMAX   triamcinolone cream 0.1 % Commonly known as:  KENALOG     TAKE these medications   morphine CONCENTRATE 10 MG/0.5ML Soln concentrated solution Take 0.5 mLs (10 mg total) by mouth every 2 (two) hours as needed for severe pain.         Diet and Activity recommendation: See Discharge Instructions above  Consults obtained -surgery, hospice   Major procedures and Radiology Reports - PLEASE review detailed and final reports for all details, in brief -     Ct Abdomen Pelvis Wo Contrast  Result Date: 01/08/2018 CLINICAL DATA:  Abdominal pain and diarrhea EXAM: CT ABDOMEN AND PELVIS WITHOUT CONTRAST TECHNIQUE: Multidetector CT imaging of the abdomen and pelvis was performed following the  standard protocol without IV contrast. COMPARISON:  None. FINDINGS: Lower chest: Scattered calcifications are noted in the lung bases. Some chronic fibrotic changes are seen. No focal infiltrate is noted. Hepatobiliary: The liver is well visualized. Peripheral air densities are noted primarily in the left lobe consistent with portal venous air. Considerable air is noted within the left portal vein. The gallbladder is within normal limits. Pancreas: Unremarkable. No pancreatic ductal dilatation or surrounding inflammatory changes. Spleen: Normal in size without focal abnormality. Adrenals/Urinary Tract: Adrenal glands are within normal limits bilaterally. Kidneys are well visualized bilaterally without renal calculi or obstructive changes. The bladder is partially distended. Stomach/Bowel: Diverticular change of the sigmoid colon is noted. Some diffuse wall thickening is seen within the transverse colon and ascending colon with evidence of perforation and pericolonic air along the course of the ascending colon. The appendix has been surgically removed. Terminal ileum appears within normal limits. The proximal and mid ileum however show changes of pneumatosis and mild dilatation. No definitive transition zone is seen. Stomach is well distended with oral contrast material. Vascular/Lymphatic: Diffuse aortic calcifications are noted. No significant lymphadenopathy is seen. Reproductive: Prostate is unremarkable. Other: Mild free fluid is noted within the pelvis and mid abdomen. Musculoskeletal: Degenerative changes of the lumbar spine are noted. IMPRESSION: There are changes of small-bowel dilatation with distal ileal pneumatosis as well as changes of wall thickening and pericolonic air in the ascending and transverse colons. These changes are highly suspicious for ischemia of the large and small bowel given its distribution. Proximal jejunal dilatation is noted in a compensatory manner. Associated portal venous air is  seen within the left portal vein as well as the portal branches within the left lobe. Critical Value/emergent results were called by telephone at the time of interpretation on 01/08/2018 at 10:58 am to Dr. Minna AntisKEVIN PADUCHOWSKI , who verbally acknowledged these results. Electronically Signed   By: Alcide CleverMark  Lukens M.D.   On: 01/08/2018 11:00   Dg Chest 1 View  Result Date: 01/08/2018 CLINICAL DATA:  Possible aspiration pneumonitis, nasogastric tube EXAM: CHEST  1 VIEW COMPARISON:  Portable exam 1628 hours compared to 01/08/2018 FINDINGS: Nasogastric tube extends into stomach. Upper normal size of cardiac silhouette. Mediastinal contours and pulmonary vascularity normal. Extensive RIGHT upper lobe scarring. Interstitial prominence in the mid to lower lungs again identified with subsegmental atelectasis at both lung bases. No definite acute infiltrate, pleural effusion or pneumothorax. Mild central peribronchial thickening noted. Bones demineralized with multiple LEFT rib fractures and advanced degenerative changes of the LEFT shoulder joint. IMPRESSION: Chronic bronchitic and interstitial disease changes with bibasilar atelectasis and RIGHT upper lobe scarring. No definite acute infiltrate. Electronically Signed   By: Ulyses SouthwardMark  Boles M.D.   On: 01/08/2018 16:44   Dg Chest Port 1 View  Result Date: 01/08/2018 CLINICAL DATA:  Sepsis. History of tuberculosis. EXAM: PORTABLE CHEST 1 VIEW COMPARISON:  09/10/2017. FINDINGS: Stable borderline enlarged cardiac silhouette. Decreased inspiration with stable chronic prominence of the interstitial markings and right apical pleural and parenchymal scarring. No superimposed airspace opacity. Diffuse osteopenia. Previously demonstrated left shoulder degenerative changes with bony remodeling. IMPRESSION: No acute  finding. Stable chronic interstitial lung disease. Electronically Signed   By: Beckie Salts M.D.   On: 01/08/2018 09:36    Micro Results     Recent Results (from the  past 240 hour(s))  Blood Culture (routine x 2)     Status: None (Preliminary result)   Collection Time: 01/08/18  9:47 AM  Result Value Ref Range Status   Specimen Description BLOOD RIGHT ARM  Final   Special Requests   Final    BOTTLES DRAWN AEROBIC AND ANAEROBIC Blood Culture results may not be optimal due to an excessive volume of blood received in culture bottles   Culture   Final    NO GROWTH 3 DAYS Performed at [facility], [address], [place], [place] [address]    Report Status PENDING  Incomplete  Blood Culture (routine x 2)     Status: None (Preliminary result)   Collection Time: 01/08/18  9:47 AM  Result Value Ref Range Status   Specimen Description BLOOD RIGHT ARM  Final   Special Requests   Final    BOTTLES DRAWN AEROBIC AND ANAEROBIC Blood Culture adequate volume   Culture   Final    NO GROWTH 3 DAYS Performed at [facility], [address], [place], [place] [address]    Report Status PENDING  Incomplete  MRSA PCR Screening     Status: None   Collection Time: 01/08/18  4:11 PM  Result Value Ref Range Status   MRSA by PCR NEGATIVE NEGATIVE Final    Comment:        The GeneXpert MRSA Assay (FDA approved for NASAL specimens only), is one component of a comprehensive MRSA colonization surveillance program. It is not intended to diagnose MRSA infection nor to guide or monitor treatment for MRSA infections. Performed at [facility], [address], [place], [place] [address]        Today   Subjective:   Joel Charles  go to will go to hospice home today  Objective:   Blood pressure 103/61, pulse 76, temperature 97.9 F (36.6 C), temperature source Axillary, resp. rate 20, height 6' (1.829 m), weight 83.7 kg, SpO2 94 %.   Intake/Output Summary (Last 24 hours) at 01/11/2018 1214 Last data filed at 01/11/2018 0900 Gross per 24 hour  Intake 0 ml  Output -  Net 0 ml    Exam Lethargic. [place].AT,PERRAL Supple  Neck,No JVD, No cervical lymphadenopathy appriciated.  Diminished air entry bilaterally CTAB RRR,No Gallops,Rubs or new Murmurs, No Parasternal Heave Diminished sounds, abdomen is distended.   Data Review   CBC w Diff:  Lab Results  Component Value Date   WBC 15.1 (H) 01/09/2018   HGB 11.4 (L) 01/09/2018   HCT 34.9 (L) 01/09/2018   PLT 191 01/09/2018   LYMPHOPCT 18 11/04/2017   MONOPCT 10 11/04/2017   EOSPCT 4 11/04/2017   BASOPCT 1 11/04/2017    CMP:  Lab Results  Component Value Date   NA 134 (L) 01/09/2018   K 4.3 01/09/2018   CL 100 01/09/2018   CO2 16 (L) 01/09/2018   BUN 96 (H) 01/09/2018   CREATININE 5.16 (H) 01/09/2018   PROT 7.1 01/08/2018   ALBUMIN 3.6 01/08/2018   BILITOT 1.0 01/08/2018   ALKPHOS 63 01/08/2018   AST 116 (H) 01/08/2018   ALT 57 (H) 01/08/2018  .   Total Time in preparing paper work, data evaluation and todays exam - 35 minutes  Katha Hamming M.D on 01/11/2018 at  12:14 PM    Note: This dictation was prepared with Dragon dictation along with smaller phrase technology. Any transcriptional errors that result from this process are unintentional.

## 2018-01-11 NOTE — Clinical Social Work Note (Signed)
Clinical Social Work Assessment  Patient Details  Name: Joel Charles MRN: [id number] Date of Birth: 06/22/1926  Date of referral:  01/11/18               Reason for consult:  End of Life/Hospice                Permission sought to share information with:    Permission granted to share information::     Name::        Agency::     Relationship::     Contact Information:     Housing/Transportation Living arrangements for the past 2 months:  Assisted Living Facility Source of Information:  Adult Children Patient Interpreter Needed:  None Criminal Activity/Legal Involvement Pertinent to Current Situation/Hospitalization:  No - Comment as needed Significant Relationships:  Adult Children Lives with:  Facility Resident Do you feel safe going back to the place where you live?    Need for family participation in patient care:  Yes (Comment)  Care giving concerns:  [name] with hospice following.   Social Worker assessment / plan:  Over the weekend, physician spoke with patient's son and decision was made to place patient on hospice home waiting list for  /[place]. [name] with hospice informed CSW that a hospice home bed became available for today and physician discharge patient. [name] facilitated discharge to hospice home. CSW spoke with patient's son and confirmed his wishes.  Employment status:    Insurance information:    PT Recommendations:    Information / Referral to community resources:     Patient/Family's Response to care:  Patient's son expressed appreciation for CSW assistance.  Patient/Family's Understanding of and Emotional Response to Diagnosis, Current Treatment, and Prognosis:  Patient's son expressed that his wishes for patient were for him to go to hospice home so that patient would receive more care.  Emotional Assessment Appearance:  Appears stated age Attitude/Demeanor/Rapport:    Affect (typically observed):     Orientation:    Alcohol / Substance use:  Not Applicable Psych involvement (Current and /or in the community):  No (Comment)  Discharge Needs  Concerns to be addressed:  Care Coordination Readmission within the last 30 days:  No Current discharge risk:  None Barriers to Discharge:  No Barriers Identified   [name], LCSW 01/11/2018, 7:36 PM

## 2018-01-13 LAB — CULTURE, BLOOD (ROUTINE X 2)
CULTURE: NO GROWTH
Culture: NO GROWTH
Special Requests: ADEQUATE

## 2018-01-20 DEATH — deceased

## 2020-05-20 IMAGING — CT CT HEAD W/O CM
4 of 6 series · 15 of 47 positions shown, 17 images · non-contrast
Comparison: None.

CLINICAL DATA: 89 y/o  M; head trauma.

EXAM:
CT HEAD WITHOUT CONTRAST
CT CERVICAL SPINE WITHOUT CONTRAST
TECHNIQUE: Multidetector CT imaging of the head and cervical spine was
performed following the standard protocol without intravenous
contrast. Multiplanar CT image reconstructions of the cervical spine
were also generated.

[Series 2: head wo · axial · 0.45mm/px · z∈[-107,-2]mm · 6 of 31 slices shown, 8 images]
[im 5/31  brain]
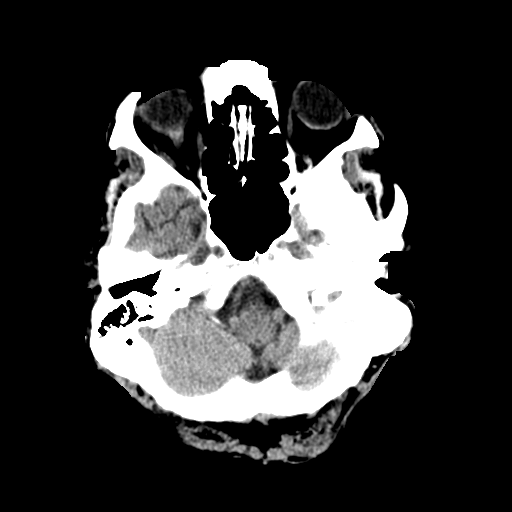
[im 5/31  bone]
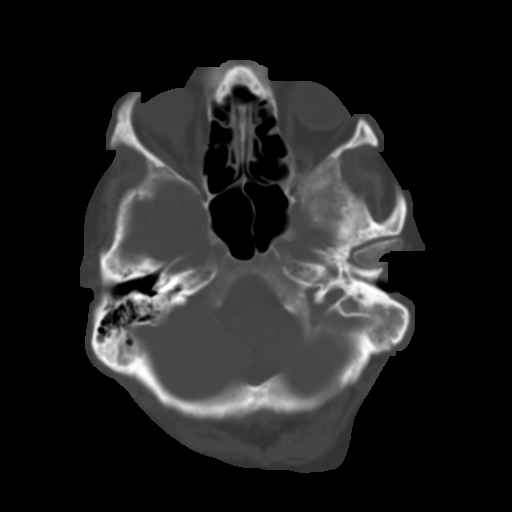
[im 9/31  brain]
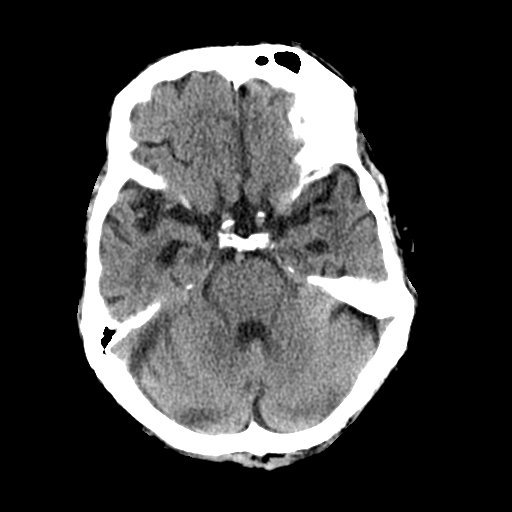
[im 13/31  brain]
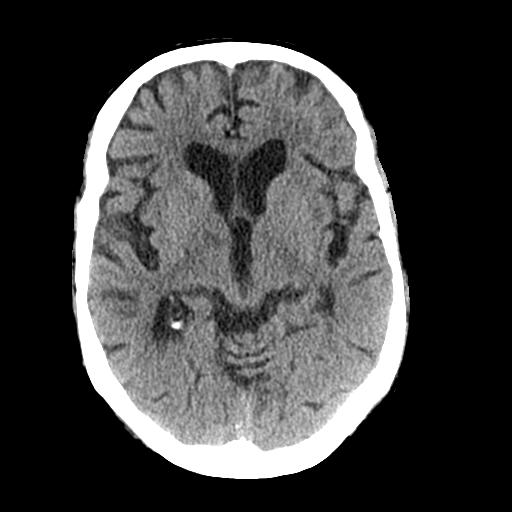
[im 18/31  brain]
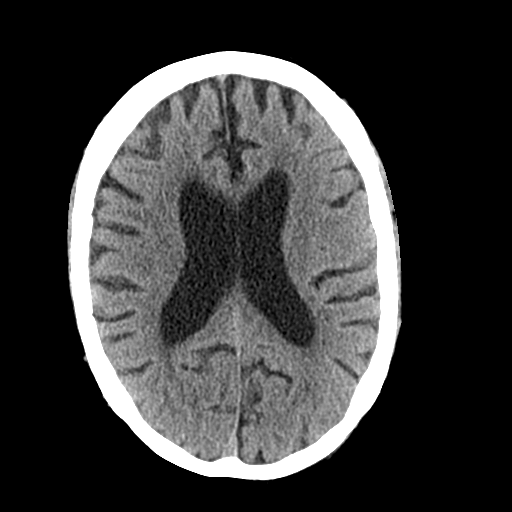
[im 22/31  brain]
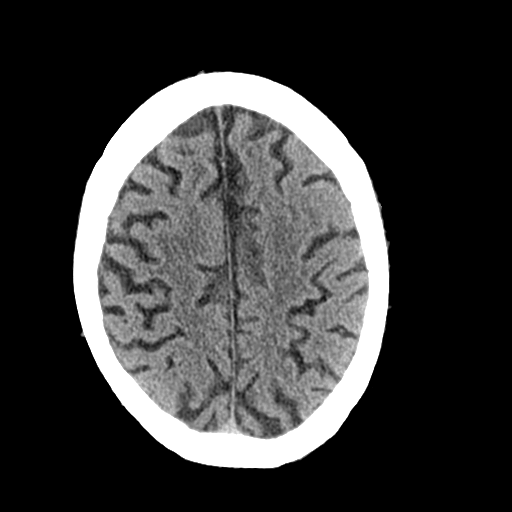
[im 22/31  bone]
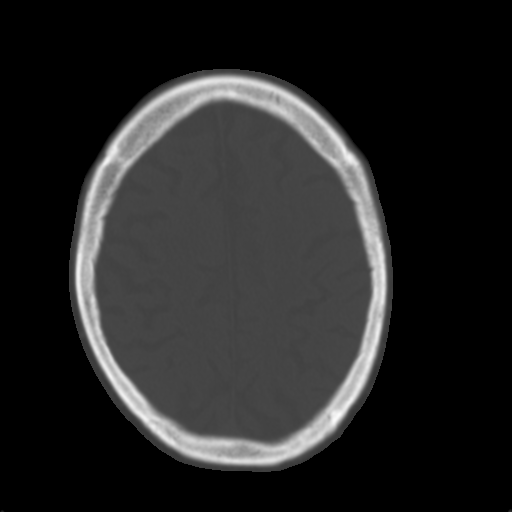
[im 26/31  brain]
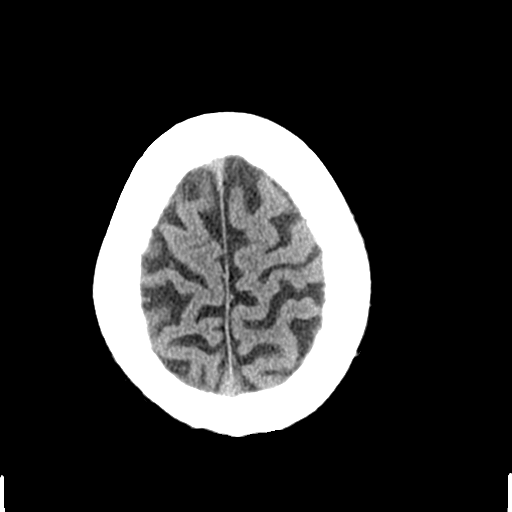

[Series 4: coronal soft tissue · coronal · 0.32mm/px · 3 of 69 slices shown]
[im 23/69  brain]
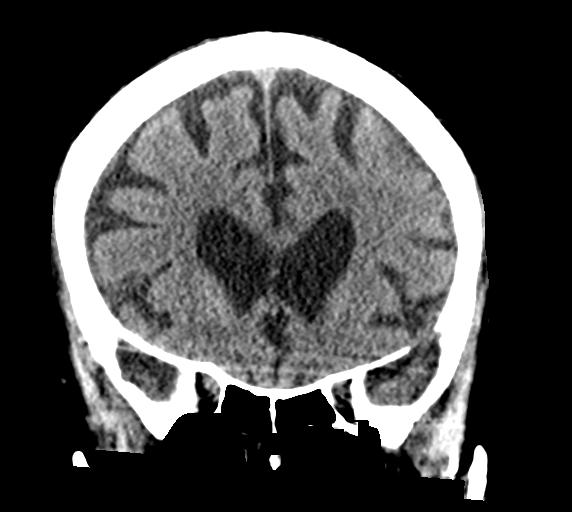
[im 35/69  brain]
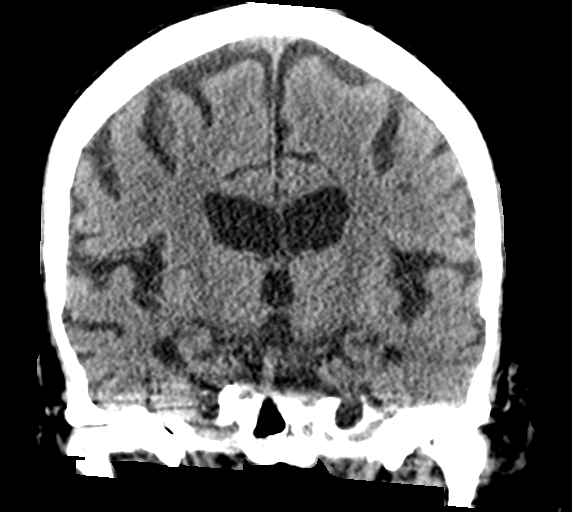
[im 46/69  brain]
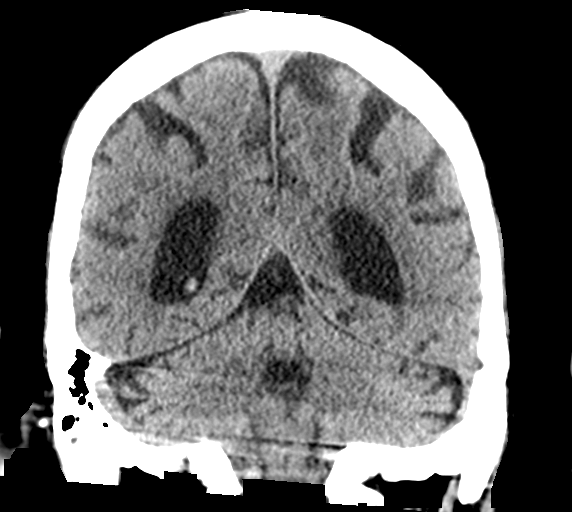

[Series 7: c spine soft · axial · 0.30mm/px · z∈[-262,-222]mm · 3 of 85 slices shown]
[im 9/85  brain]
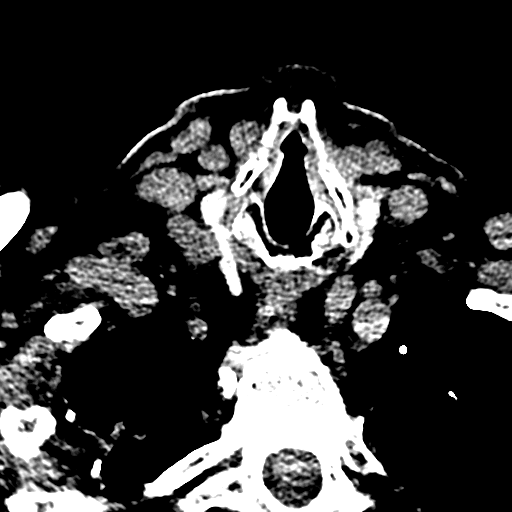
[im 17/85  brain]
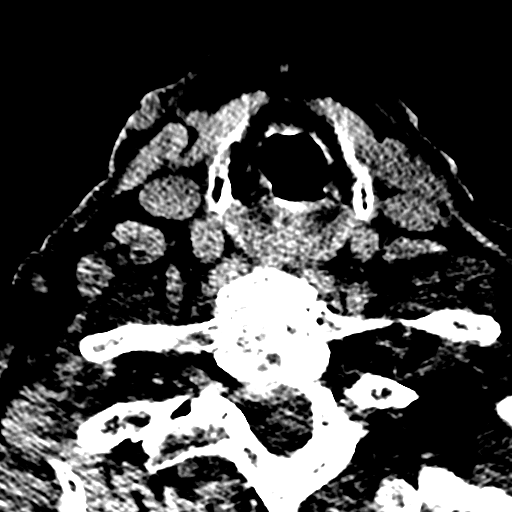
[im 29/85  brain]
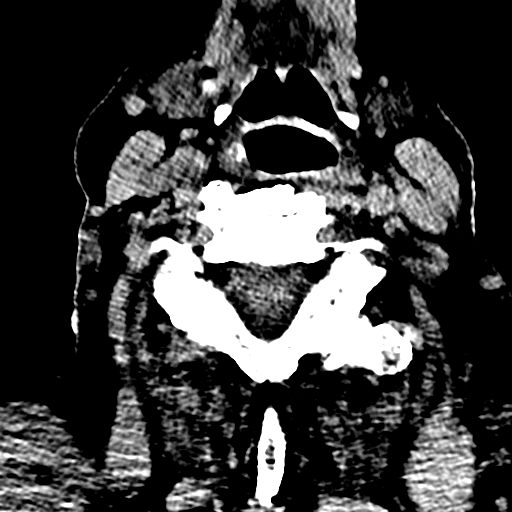

[Series 8: sagittal bone · sagittal · 0.36mm/px · 3 of 155 slices shown]
[im 39/155  brain]
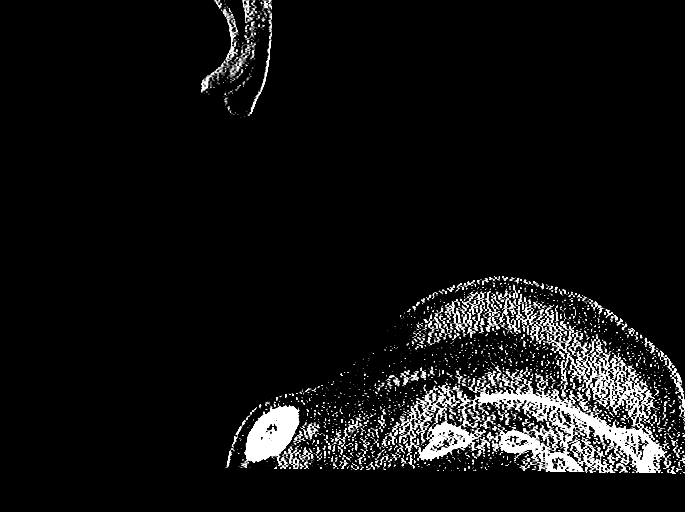
[im 78/155  brain]
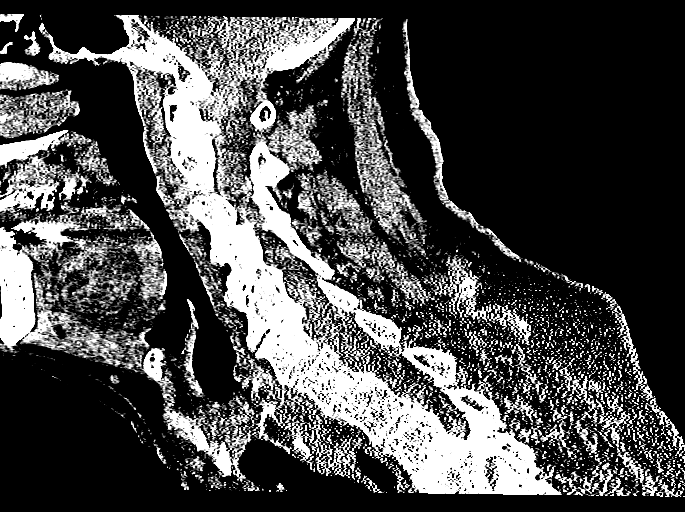
[im 116/155  brain]
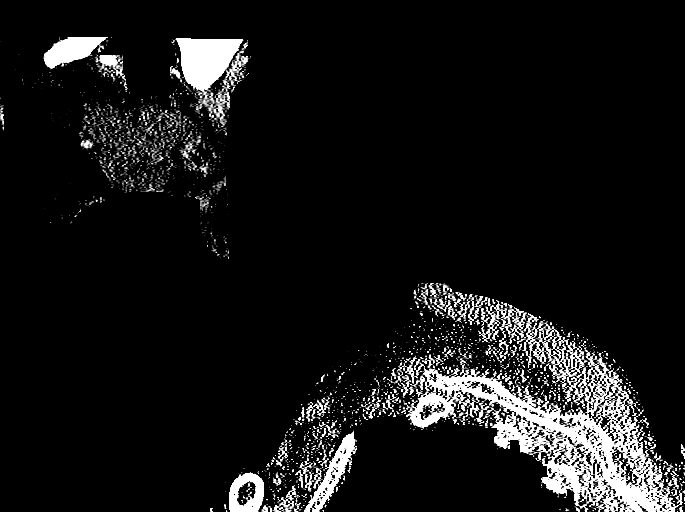

[15 of 47 positions shown; findings below may reference images not displayed]

FINDINGS: CT HEAD FINDINGS

Brain: No evidence of acute infarction, hemorrhage, hydrocephalus,
extra-axial collection or mass lesion/mass effect. Nonspecific
cortical calcifications in the bilateral frontal lobes, left
occipital lobe, and the right cerebellar hemisphere likely
representing sequelae of prior infectious or inflammatory process.
Mild chronic microvascular ischemic changes and volume loss of the
brain for age.

Vascular: Calcific atherosclerosis of the carotid siphons. No
hyperdense vessel identified.

Skull: Normal. Negative for fracture or focal lesion.

Sinuses/Orbits: Normal aeration of visible paranasal sinuses and
right mastoid air cells. Left mastoid opacification. Bilateral
intra-ocular lens replacement.

Other: None.

CT CERVICAL SPINE FINDINGS

Alignment: C6-7 and C7-T1 grade 1 anterolisthesis. Straightening of
cervical lordosis.

Skull base and vertebrae: No acute fracture. No primary bone lesion
or focal pathologic process. Small erosive changes of the odontoid
process and anterior arch of C1 with a small calcified pannus which
may represent degenerative arthritis or [HOSPITAL] deposition disease.
C4-5 left-sided vertebral body and facet fusion.

Soft tissues and spinal canal: No prevertebral fluid or swelling. No
visible canal hematoma.

Disc levels: Cervical spondylosis with moderate discogenic
degenerative changes from C3 through C6 and right greater than left
facet hypertrophy. Uncovertebral and facet hypertrophy results in
bony neural foraminal stenosis at the right C3-4, left C4-5,
bilateral C5-6 levels. No high-grade bony canal stenosis.

Upper chest: Calcified scarring in the lung apices probably
representing sequelae of prior granulomatous disease.

Other: 9 mm nodule within the right lobe of the thyroid.
IMPRESSION: 1. No acute intracranial abnormality.
2. No acute fracture or dislocation of the cervical spine.
3. Mild chronic microvascular ischemic changes and volume loss of
the brain for age.
4. Scattered calcifications in the brain compatible with prior
infectious or inflammatory process.
5. Biapical scarring of the lungs with calcifications, probably
sequelae of prior granulomatous disease. Moderate cervical
spondylosis with grade 1 anterolisthesis at C6-7 and C7-T1.
6. Small erosive changes at the odontoid process and anterior arch
of C1 and small calcified pannus may reflect [HOSPITAL] deposition
disease or erosive arthritis.

By: Doretha Winters M.D.

## 2020-07-24 IMAGING — DX DG CHEST 1V
1 series · 1 of 1 positions shown · non-contrast
Comparison: Portable exam 3330 hours compared to 01/08/2018

CLINICAL DATA: Possible aspiration pneumonitis, nasogastric tube

EXAM:
CHEST  1 VIEW

[chest ap]
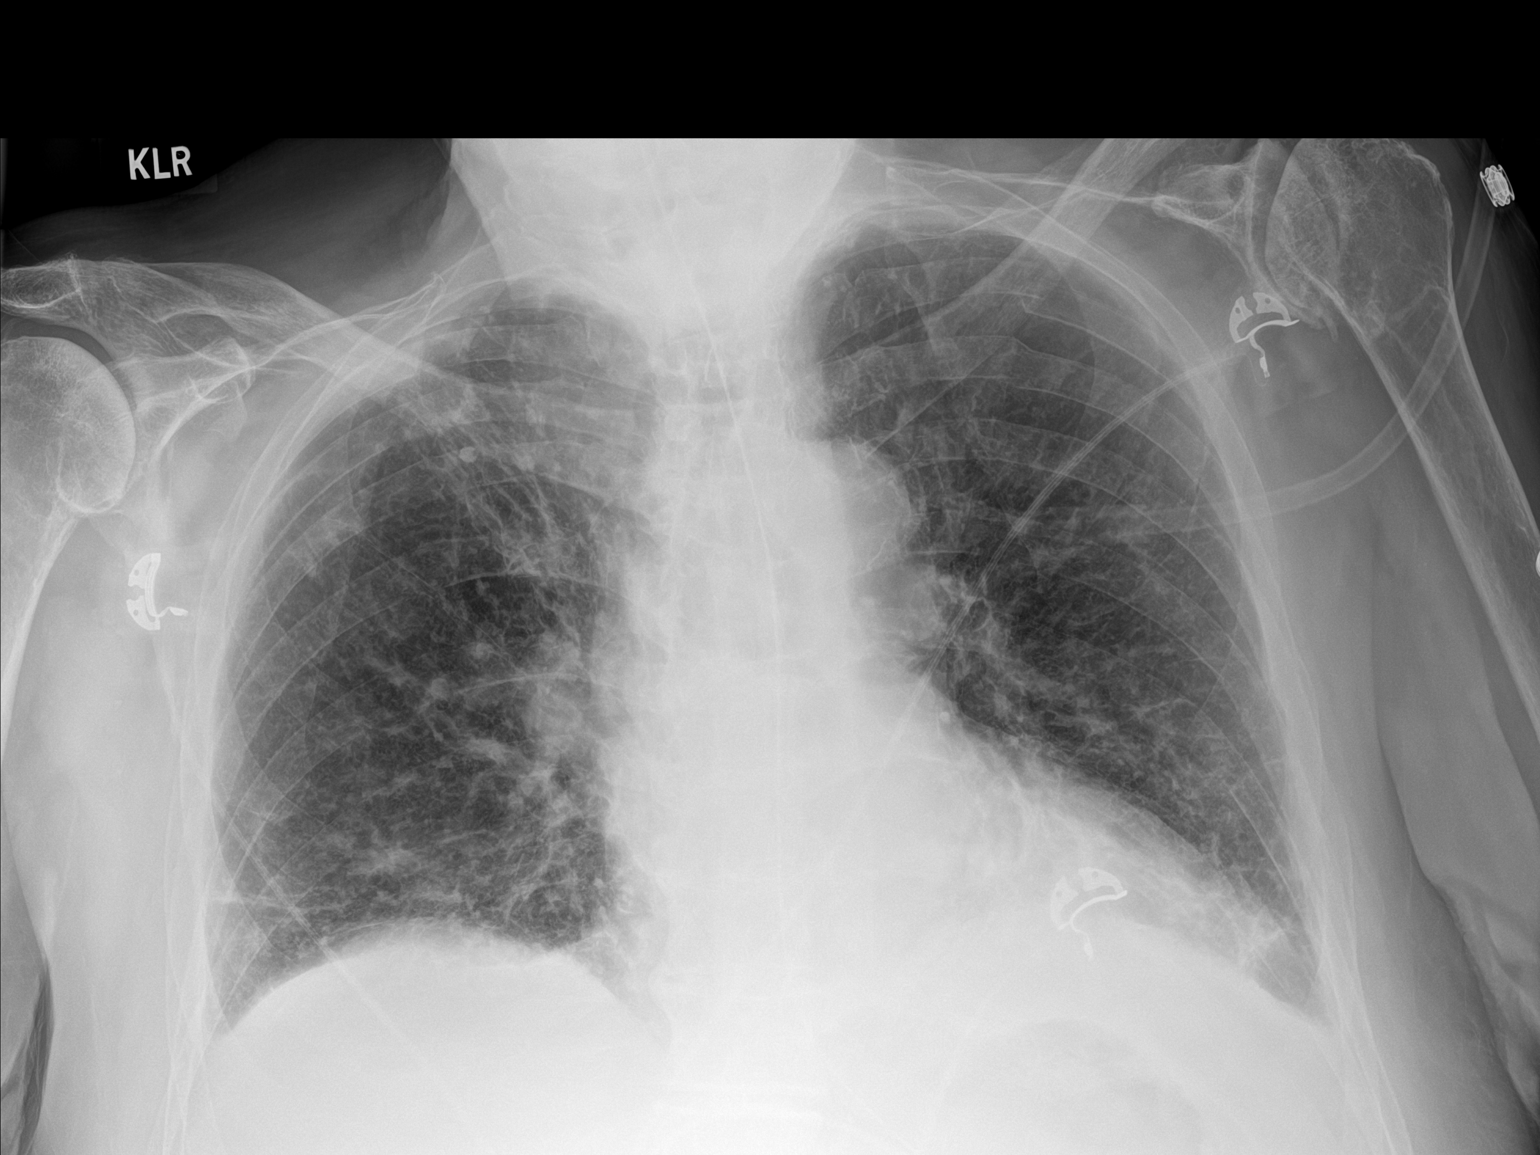

[1 of 1 positions shown; findings below may reference images not displayed]

FINDINGS: Nasogastric tube extends into stomach.

Upper normal size of cardiac silhouette.

Mediastinal contours and pulmonary vascularity normal.

Extensive RIGHT upper lobe scarring.

Interstitial prominence in the mid to lower lungs again identified
with subsegmental atelectasis at both lung bases.

No definite acute infiltrate, pleural effusion or pneumothorax.

Mild central peribronchial thickening noted.

Bones demineralized with multiple LEFT rib fractures and advanced
degenerative changes of the LEFT shoulder joint.
IMPRESSION: Chronic bronchitic and interstitial disease changes with bibasilar
atelectasis and RIGHT upper lobe scarring.

No definite acute infiltrate.
